# Patient Record
Sex: Female | Born: 1970 | Race: White | Hispanic: Yes | Marital: Married | Smoking: Never smoker
Health system: Southern US, Community
[De-identification: ages and names within clinical notes are randomized; demographics above are authoritative.]

## PROBLEM LIST (undated history)

## (undated) DIAGNOSIS — F32A Depression, unspecified: Secondary | ICD-10-CM

## (undated) DIAGNOSIS — I10 Essential (primary) hypertension: Secondary | ICD-10-CM

## (undated) DIAGNOSIS — IMO0001 Reserved for inherently not codable concepts without codable children: Secondary | ICD-10-CM

## (undated) DIAGNOSIS — N920 Excessive and frequent menstruation with regular cycle: Secondary | ICD-10-CM

## (undated) DIAGNOSIS — D649 Anemia, unspecified: Secondary | ICD-10-CM

## (undated) DIAGNOSIS — F329 Major depressive disorder, single episode, unspecified: Secondary | ICD-10-CM

## (undated) DIAGNOSIS — E7800 Pure hypercholesterolemia, unspecified: Secondary | ICD-10-CM

## (undated) HISTORY — DX: Pure hypercholesterolemia, unspecified: E78.00

## (undated) HISTORY — PX: ABDOMINAL HYSTERECTOMY: SHX81

## (undated) HISTORY — PX: BLADDER SURGERY: SHX569

## (undated) HISTORY — PX: CHOLECYSTECTOMY: SHX55

## (undated) HISTORY — DX: Excessive and frequent menstruation with regular cycle: N92.0

## (undated) HISTORY — PX: WISDOM TOOTH EXTRACTION: SHX21

---

## 2001-08-01 ENCOUNTER — Emergency Department (HOSPITAL_COMMUNITY): Admission: EM | Admit: 2001-08-01 | Discharge: 2001-08-01 | Payer: Self-pay | Admitting: *Deleted

## 2001-08-11 ENCOUNTER — Emergency Department (HOSPITAL_COMMUNITY): Admission: EM | Admit: 2001-08-11 | Discharge: 2001-08-11 | Payer: Self-pay | Admitting: Emergency Medicine

## 2005-08-23 ENCOUNTER — Ambulatory Visit: Payer: Self-pay | Admitting: Family Medicine

## 2005-08-24 ENCOUNTER — Ambulatory Visit: Payer: Self-pay | Admitting: *Deleted

## 2005-10-11 ENCOUNTER — Encounter (INDEPENDENT_AMBULATORY_CARE_PROVIDER_SITE_OTHER): Payer: Self-pay | Admitting: Family Medicine

## 2005-10-11 ENCOUNTER — Ambulatory Visit: Payer: Self-pay | Admitting: Family Medicine

## 2005-10-15 ENCOUNTER — Ambulatory Visit (HOSPITAL_COMMUNITY): Admission: RE | Admit: 2005-10-15 | Discharge: 2005-10-15 | Payer: Self-pay | Admitting: Family Medicine

## 2007-03-05 ENCOUNTER — Encounter (INDEPENDENT_AMBULATORY_CARE_PROVIDER_SITE_OTHER): Payer: Self-pay | Admitting: *Deleted

## 2007-03-13 ENCOUNTER — Encounter: Payer: Self-pay | Admitting: Internal Medicine

## 2007-03-13 ENCOUNTER — Ambulatory Visit: Payer: Self-pay | Admitting: Family Medicine

## 2007-03-13 LAB — CONVERTED CEMR LAB
ALT: 17 units/L (ref 0–35)
Basophils Absolute: 0.1 10*3/uL (ref 0.0–0.1)
CO2: 22 meq/L (ref 19–32)
Calcium: 9.4 mg/dL (ref 8.4–10.5)
Chloride: 104 meq/L (ref 96–112)
Eosinophils Absolute: 0.2 10*3/uL (ref 0.0–0.7)
Eosinophils Relative: 2 % (ref 0–5)
HCT: 38.4 % (ref 36.0–46.0)
Lymphocytes Relative: 30 % (ref 12–46)
Lymphs Abs: 3 10*3/uL (ref 0.7–3.3)
MCV: 79.8 fL (ref 78.0–100.0)
Monocytes Relative: 5 % (ref 3–11)
Neutro Abs: 6.3 10*3/uL (ref 1.7–7.7)
Platelets: 397 10*3/uL (ref 150–400)
Potassium: 4.1 meq/L (ref 3.5–5.3)
Total Bilirubin: 0.3 mg/dL (ref 0.3–1.2)
Total Protein: 7.6 g/dL (ref 6.0–8.3)

## 2007-05-13 ENCOUNTER — Emergency Department (HOSPITAL_COMMUNITY): Admission: EM | Admit: 2007-05-13 | Discharge: 2007-05-13 | Payer: Self-pay | Admitting: Emergency Medicine

## 2008-04-13 ENCOUNTER — Ambulatory Visit (HOSPITAL_COMMUNITY): Admission: RE | Admit: 2008-04-13 | Discharge: 2008-04-13 | Payer: Self-pay | Admitting: Family Medicine

## 2008-05-22 ENCOUNTER — Emergency Department (HOSPITAL_COMMUNITY): Admission: EM | Admit: 2008-05-22 | Discharge: 2008-05-23 | Payer: Self-pay | Admitting: Emergency Medicine

## 2008-05-27 ENCOUNTER — Ambulatory Visit: Payer: Self-pay | Admitting: Internal Medicine

## 2008-06-25 ENCOUNTER — Encounter (INDEPENDENT_AMBULATORY_CARE_PROVIDER_SITE_OTHER): Payer: Self-pay | Admitting: Family Medicine

## 2008-06-25 ENCOUNTER — Ambulatory Visit: Payer: Self-pay | Admitting: Family Medicine

## 2008-08-24 ENCOUNTER — Emergency Department (HOSPITAL_COMMUNITY): Admission: EM | Admit: 2008-08-24 | Discharge: 2008-08-25 | Payer: Self-pay | Admitting: *Deleted

## 2008-09-07 ENCOUNTER — Encounter (INDEPENDENT_AMBULATORY_CARE_PROVIDER_SITE_OTHER): Payer: Self-pay | Admitting: Family Medicine

## 2008-09-07 ENCOUNTER — Ambulatory Visit: Payer: Self-pay | Admitting: Internal Medicine

## 2008-09-07 LAB — CONVERTED CEMR LAB
Chloride: 104 meq/L (ref 96–112)
Glucose, Bld: 92 mg/dL (ref 70–99)
LDL Cholesterol: 110 mg/dL — ABNORMAL HIGH (ref 0–99)
Potassium: 4.5 meq/L (ref 3.5–5.3)
Sodium: 140 meq/L (ref 135–145)
Total CHOL/HDL Ratio: 5.6
Total Protein: 7.2 g/dL (ref 6.0–8.3)
Vit D, 25-Hydroxy: 15 ng/mL — ABNORMAL LOW (ref 30–89)

## 2008-10-04 ENCOUNTER — Encounter (INDEPENDENT_AMBULATORY_CARE_PROVIDER_SITE_OTHER): Payer: Self-pay | Admitting: General Surgery

## 2008-10-04 ENCOUNTER — Ambulatory Visit (HOSPITAL_COMMUNITY): Admission: RE | Admit: 2008-10-04 | Discharge: 2008-10-05 | Payer: Self-pay | Admitting: *Deleted

## 2009-06-27 IMAGING — RF DG CHOLANGIOGRAM OPERATIVE
1 series · 4 of 4 positions shown · non-contrast
Comparison: Ultrasound dated 08/25/2008

CLINICAL DATA: Cholelithiasis

INTRAOPERATIVE CHOLANGIOGRAM
TECHNIQUE: Multiple fluoroscopic spot radiographs were obtained
during intraoperative cholangiogram and are submitted for
interpretation post-operatively.
Fluoroscopy Time: 24 seconds

[Series 1: run · 4 of 46 frames shown]
[frame 7/46]
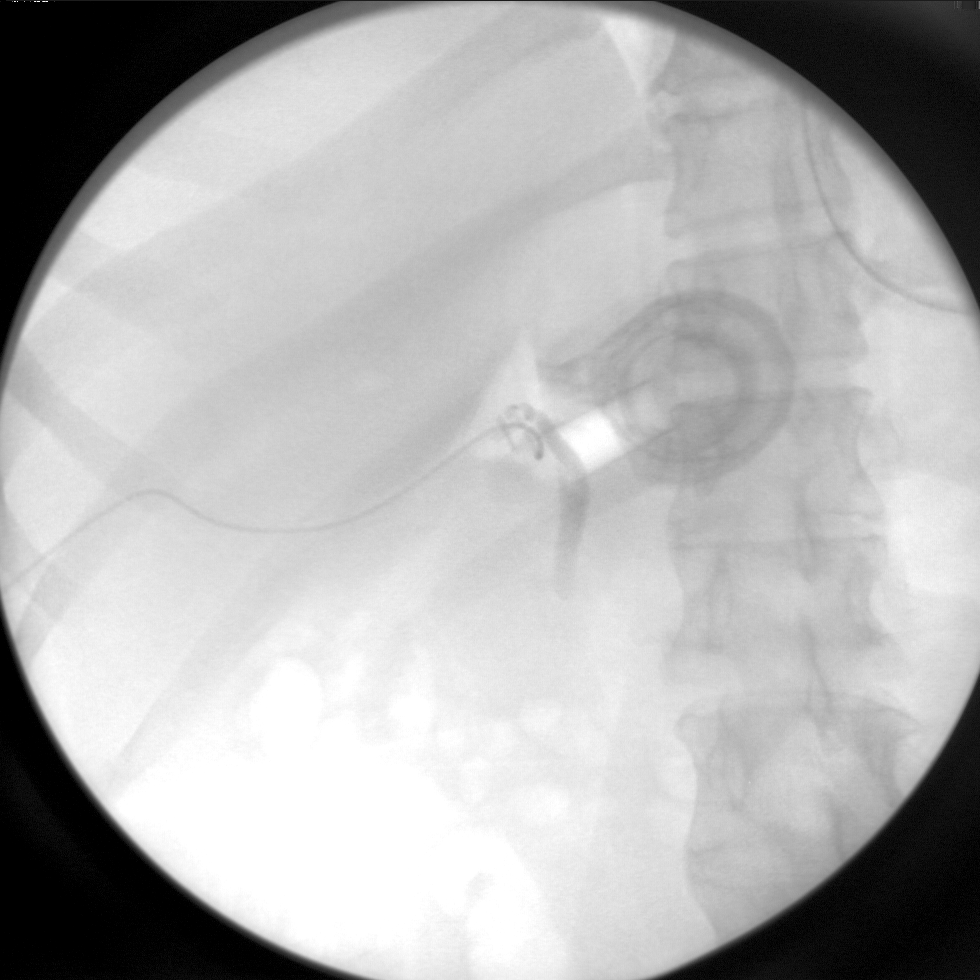
[frame 24/46]
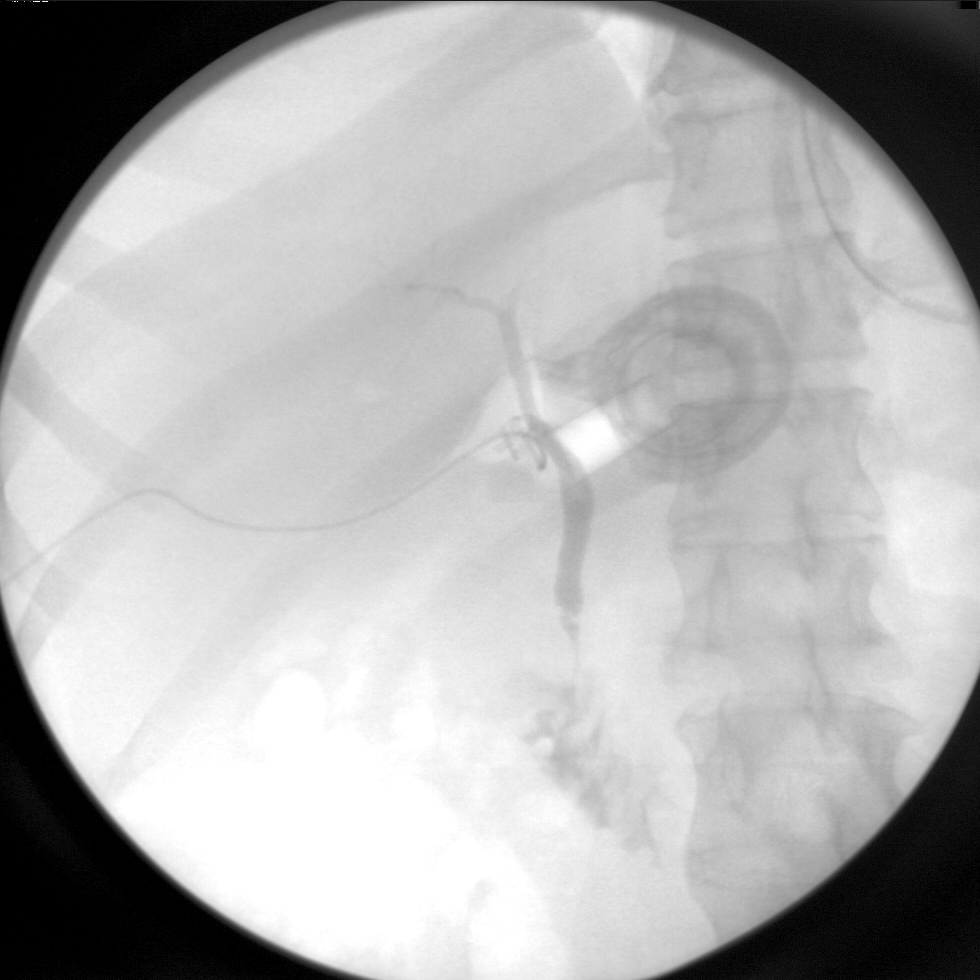
[frame 40/46]
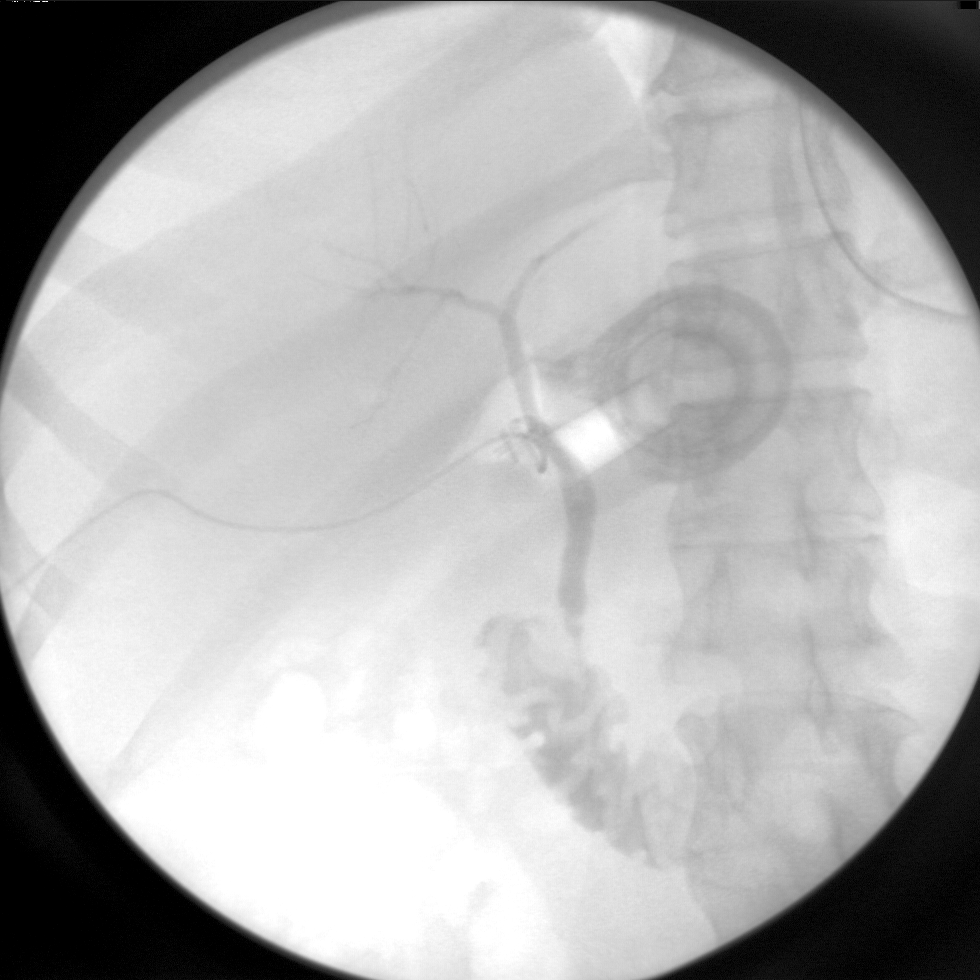
[frame 44/46]
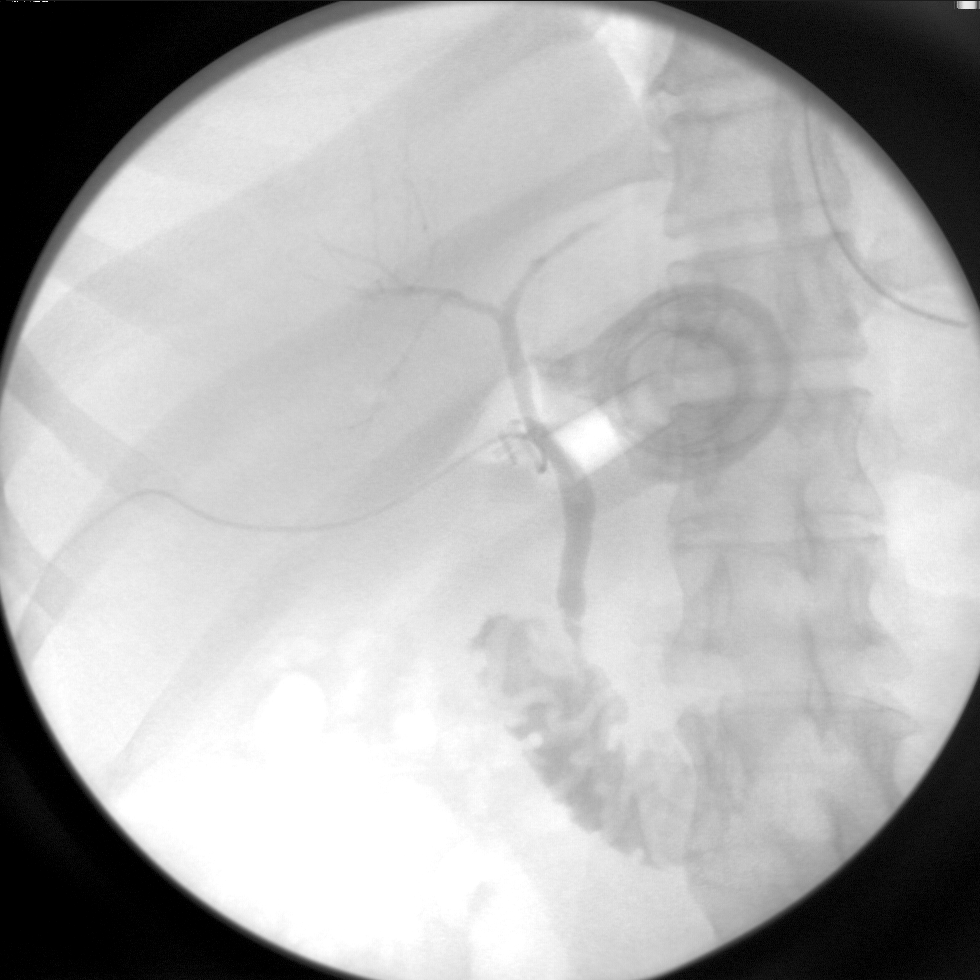

[4 of 4 positions shown; findings below may reference images not displayed]

FINDINGS: Contrast injection of the cystic duct opacifies a normal
caliber common bile duct with reflux into the intrahepatic ducts.
Appropriate drainage into the duodenum is identified.

No extravasation of contrast material noted.

Within the distal common bile duct there are several filling
defects which may represent gallstones.
IMPRESSION: 1.  Filling defects within the distal common bile duct may
represent choledocholithiasis.

## 2009-08-25 ENCOUNTER — Emergency Department (HOSPITAL_COMMUNITY): Admission: EM | Admit: 2009-08-25 | Discharge: 2009-08-26 | Payer: Self-pay | Admitting: Emergency Medicine

## 2010-07-27 ENCOUNTER — Emergency Department (HOSPITAL_COMMUNITY)
Admission: EM | Admit: 2010-07-27 | Discharge: 2010-07-27 | Disposition: A | Discharge status: Home / Self Care | Payer: Self-pay | Attending: Emergency Medicine | Admitting: Emergency Medicine

## 2010-07-27 DIAGNOSIS — R109 Unspecified abdominal pain: Secondary | ICD-10-CM | POA: Insufficient documentation

## 2010-07-27 DIAGNOSIS — R197 Diarrhea, unspecified: Secondary | ICD-10-CM | POA: Insufficient documentation

## 2010-07-27 DIAGNOSIS — R112 Nausea with vomiting, unspecified: Secondary | ICD-10-CM | POA: Insufficient documentation

## 2010-07-27 DIAGNOSIS — I10 Essential (primary) hypertension: Secondary | ICD-10-CM | POA: Insufficient documentation

## 2010-07-27 LAB — CBC
HCT: 36 % (ref 36.0–46.0)
MCHC: 33.1 g/dL (ref 30.0–36.0)
RDW: 15.6 % — ABNORMAL HIGH (ref 11.5–15.5)
WBC: 16.7 10*3/uL — ABNORMAL HIGH (ref 4.0–10.5)

## 2010-07-27 LAB — DIFFERENTIAL
Eosinophils Absolute: 0.1 10*3/uL (ref 0.0–0.7)
Lymphocytes Relative: 12 % (ref 12–46)
Monocytes Absolute: 0.6 10*3/uL (ref 0.1–1.0)
Monocytes Relative: 4 % (ref 3–12)
Neutro Abs: 14.1 10*3/uL — ABNORMAL HIGH (ref 1.7–7.7)
Neutrophils Relative %: 84 % — ABNORMAL HIGH (ref 43–77)

## 2010-07-27 LAB — URINALYSIS, ROUTINE W REFLEX MICROSCOPIC
Bilirubin Urine: NEGATIVE
Ketones, ur: 15 mg/dL — AB
Nitrite: NEGATIVE
Protein, ur: NEGATIVE mg/dL
Specific Gravity, Urine: 1.025 (ref 1.005–1.030)
Urine Glucose, Fasting: NEGATIVE mg/dL
pH: 6 (ref 5.0–8.0)

## 2010-07-27 LAB — COMPREHENSIVE METABOLIC PANEL
Albumin: 3.8 g/dL (ref 3.5–5.2)
Alkaline Phosphatase: 76 U/L (ref 39–117)
CO2: 25 mEq/L (ref 19–32)
Calcium: 9 mg/dL (ref 8.4–10.5)
Chloride: 104 mEq/L (ref 96–112)
Glucose, Bld: 129 mg/dL — ABNORMAL HIGH (ref 70–99)

## 2010-07-27 LAB — URINE MICROSCOPIC-ADD ON

## 2010-08-03 ENCOUNTER — Other Ambulatory Visit: Payer: Self-pay | Admitting: Family Medicine

## 2010-08-03 ENCOUNTER — Other Ambulatory Visit (HOSPITAL_COMMUNITY): Payer: Self-pay | Admitting: Family Medicine

## 2010-08-03 ENCOUNTER — Encounter (INDEPENDENT_AMBULATORY_CARE_PROVIDER_SITE_OTHER): Payer: Self-pay | Admitting: Family Medicine

## 2010-08-03 DIAGNOSIS — Z1231 Encounter for screening mammogram for malignant neoplasm of breast: Secondary | ICD-10-CM

## 2010-08-03 LAB — CONVERTED CEMR LAB
Alkaline Phosphatase: 72 units/L (ref 39–117)
Basophils Absolute: 0 10*3/uL (ref 0.0–0.1)
Basophils Relative: 0 % (ref 0–1)
CO2: 24 meq/L (ref 19–32)
Calcium: 9.3 mg/dL (ref 8.4–10.5)
Chloride: 101 meq/L (ref 96–112)
Eosinophils Absolute: 0.1 10*3/uL (ref 0.0–0.7)
HCT: 37.5 % (ref 36.0–46.0)
LDL Cholesterol: 152 mg/dL — ABNORMAL HIGH (ref 0–99)
Lymphocytes Relative: 26 % (ref 12–46)
Lymphs Abs: 2.5 10*3/uL (ref 0.7–4.0)
MCHC: 30.9 g/dL (ref 30.0–36.0)
Monocytes Absolute: 0.5 10*3/uL (ref 0.1–1.0)
Monocytes Relative: 5 % (ref 3–12)
Neutrophils Relative %: 68 % (ref 43–77)
RDW: 15.9 % — ABNORMAL HIGH (ref 11.5–15.5)
Total CHOL/HDL Ratio: 5
Total Protein: 7.4 g/dL (ref 6.0–8.3)
VLDL: 41 mg/dL — ABNORMAL HIGH (ref 0–40)

## 2010-08-07 ENCOUNTER — Ambulatory Visit (HOSPITAL_COMMUNITY)
Admission: RE | Admit: 2010-08-07 | Discharge: 2010-08-07 | Disposition: A | Discharge status: Home / Self Care | Payer: Self-pay | Source: Ambulatory Visit | Attending: Family Medicine | Admitting: Family Medicine

## 2010-08-07 DIAGNOSIS — Z1231 Encounter for screening mammogram for malignant neoplasm of breast: Secondary | ICD-10-CM | POA: Insufficient documentation

## 2010-09-10 LAB — COMPREHENSIVE METABOLIC PANEL
ALT: 23 U/L (ref 0–35)
Alkaline Phosphatase: 74 U/L (ref 39–117)
BUN: 7 mg/dL (ref 6–23)
Calcium: 8.7 mg/dL (ref 8.4–10.5)
GFR calc Af Amer: >60 mL/min (ref >60)
Glucose, Bld: 111 mg/dL — ABNORMAL HIGH (ref 70–99)
Sodium: 139 mEq/L (ref 135–145)

## 2010-09-10 LAB — URINALYSIS, ROUTINE W REFLEX MICROSCOPIC
Glucose, UA: NEGATIVE mg/dL
Ketones, ur: NEGATIVE mg/dL
Urobilinogen, UA: 0.2 mg/dL (ref 0.0–1.0)

## 2010-09-10 LAB — CBC
Hemoglobin: 10.9 g/dL — ABNORMAL LOW (ref 12.0–15.0)
MCHC: 33.4 g/dL (ref 30.0–36.0)
MCV: 78.5 fL (ref 78.0–100.0)
Platelets: 367 10*3/uL (ref 150–400)
RDW: 15.7 % — ABNORMAL HIGH (ref 11.5–15.5)
WBC: 12.3 10*3/uL — ABNORMAL HIGH (ref 4.0–10.5)

## 2010-09-10 LAB — DIFFERENTIAL
Basophils Relative: 1 % (ref 0–1)
Lymphocytes Relative: 27 % (ref 12–46)
Monocytes Absolute: 0.6 10*3/uL (ref 0.1–1.0)
Monocytes Relative: 5 % (ref 3–12)
Neutrophils Relative %: 67 % (ref 43–77)

## 2010-09-10 LAB — WET PREP, GENITAL
Trich, Wet Prep: NONE SEEN
Yeast Wet Prep HPF POC: NONE SEEN

## 2010-09-27 LAB — DIFFERENTIAL
Basophils Absolute: 0 10*3/uL (ref 0.0–0.1)
Eosinophils Relative: 1 % (ref 0–5)
Lymphocytes Relative: 23 % (ref 12–46)
Monocytes Absolute: 0.4 10*3/uL (ref 0.1–1.0)

## 2010-09-27 LAB — CBC
MCV: 77.3 fL — ABNORMAL LOW (ref 78.0–100.0)
Platelets: 331 10*3/uL (ref 150–400)
WBC: 9.1 10*3/uL (ref 4.0–10.5)

## 2010-09-27 LAB — COMPREHENSIVE METABOLIC PANEL
ALT: 17 U/L (ref 0–35)
AST: 18 U/L (ref 0–37)
Albumin: 3.6 g/dL (ref 3.5–5.2)
Chloride: 105 mEq/L (ref 96–112)
Creatinine, Ser: 0.58 mg/dL (ref 0.4–1.2)
GFR calc Af Amer: >60 mL/min (ref >60)
Potassium: 4 mEq/L (ref 3.5–5.1)
Sodium: 138 mEq/L (ref 135–145)
Total Bilirubin: 0.5 mg/dL (ref 0.3–1.2)

## 2010-09-28 LAB — DIFFERENTIAL
Eosinophils Absolute: 0.2 10*3/uL (ref 0.0–0.7)
Lymphocytes Relative: 29 % (ref 12–46)
Lymphs Abs: 3.5 10*3/uL (ref 0.7–4.0)
Neutrophils Relative %: 65 % (ref 43–77)

## 2010-09-28 LAB — LIPASE, BLOOD: Lipase: 31 U/L (ref 11–59)

## 2010-09-28 LAB — COMPREHENSIVE METABOLIC PANEL
CO2: 25 mEq/L (ref 19–32)
Calcium: 9 mg/dL (ref 8.4–10.5)
Creatinine, Ser: 0.59 mg/dL (ref 0.4–1.2)
GFR calc non Af Amer: >60 mL/min (ref >60)
Glucose, Bld: 114 mg/dL — ABNORMAL HIGH (ref 70–99)

## 2010-09-28 LAB — URINALYSIS, ROUTINE W REFLEX MICROSCOPIC
Bilirubin Urine: NEGATIVE
Hgb urine dipstick: NEGATIVE
Specific Gravity, Urine: 1.01 (ref 1.005–1.030)
Urobilinogen, UA: 0.2 mg/dL (ref 0.0–1.0)
pH: 6 (ref 5.0–8.0)

## 2010-09-28 LAB — CBC
Hemoglobin: 12 g/dL (ref 12.0–15.0)
MCHC: 33.5 g/dL (ref 30.0–36.0)
MCV: 78.1 fL (ref 78.0–100.0)
RBC: 4.59 MIL/uL (ref 3.87–5.11)

## 2010-10-31 NOTE — Op Note (Signed)
NAME:  Darlene Salazar, Darlene Salazar ACCOUNT NO.:  [id number]   MEDICAL RECORD NO.:  [id number]          PATIENT TYPE:  OIB   LOCATION:  0098                         FACILITY:  [facility]   PHYSICIAN:  Adolph Pollack, M.D.DATE OF BIRTH:  [birth year]   DATE OF PROCEDURE:  10/04/2008  DATE OF DISCHARGE:                               OPERATIVE REPORT   PREOPERATIVE DIAGNOSIS:  Symptomatic cholelithiasis.   POSTOPERATIVE DIAGNOSIS:  Symptomatic cholelithiasis.   PROCEDURE:  Laparoscopic cholecystectomy with intraoperative  cholangiogram.   SURGEON:  Adolph Pollack, M.D.   ASSISTANT:  Anselm Pancoast. Zachery Dakins, M.D.   ANESTHESIA:  General.   INDICATIONS:  This is a 40 year old female who presented to the [facility]  Emergency Department with right upper quadrant postprandial pain and was  diagnosed with gallstones.  Liver function tests were normal.  She was  arranged to have elective cholecystectomy by Alfonse Ras, MD, but  because of medical leave I saw her and I am performing the surgery  today.  The procedure and the risks have been explained to her before in  Spanish and she has been given a gallbladder surgery pamphlet in Spanish  as well.   TECHNIQUE:  She was seen in the holding area and brought to the  operating room, placed supine on the operating table and a general  anesthetic was administered.  Her abdominal wall was sterilely prepped  and draped.  Marcaine solution was infiltrated in the subumbilical  region.  A small subumbilical incision was made through the skin,  subcutaneous tissue, midline fascia and peritoneum, entering the  peritoneal cavity under direct vision.  A pursestring suture of zero  Vicryl was placed around the fascial edges.  A Hasson trocar was  introduced into the peritoneal cavity and pneumoperitoneum created by  insufflation of CO2 gas.   Following this the laparoscope was introduced.  She was then placed in a  reverse Trendelenburg position  with the right side tilted slightly up.  An 11 mm trocar was placed through th epigastric incision and two 5 mm  trocars were placed in the right upper quadrant.  The fundus of the  gallbladder was grasped and there was no acute inflammation.  It was  retracted to the right shoulder.  The infundibulum was grasped and it  was mobilized using careful blunt and sharp dissection close to the  gallbladder.  The infundibulum was then retracted laterally.  The cystic  duct identified and a window created around it.  A small stone was  trying to pass into the cystic duct and this was milked back into the  gallbladder.  A clip was placed just above the cystic duct gallbladder  junction and a small incision made at the cystic duct gallbladder  junction.  A cholangiocatheter was passed through the anterior abdominal  wall and placed into the cystic duct and cholangiogram was performed.   Under real time fluoroscopy dilute contrast was injected to the cystic  duct.  The cystic duct was of moderate length.  The common hepatic,  right and left hepatic, and common bile ducts all filled and the bile  drained promptly into the duodenum without obvious evidence of  obstruction.  A final report is pending the radiologist interpretation.   The cholangiocatheter was removed, the cystic duct was clipped three  times on the biliary side and divided.  Both an anterior and posterior  branch of the cystic artery were identified.  Windows were made around  these.  They were clipped and divided.  The gallbladder was dissected  free from the liver intact using electrocautery and placed in an  Endopouch bag.  The gallbladder fossa was inspected and irrigated.  Bleeding points were controlled with electrocautery.  Upon further  inspection, hemostasis was adequate.  There was no evidence of bile  leak.   The irrigation fluid was evacuated as much as possible.  The gallbladder  was removed in the bag through the  subumbilical port.  The subumbilical  fascial defect was closed under laparoscopic vision by tightening up and  tying down the pursestring suture.  The remaining trocars were removed.  The CO2 gas was released.  The skin incisions were closed with 4-0  Monocryl subcuticular stitches, followed by Steri-Strips and sterile  dressings.   She tolerated the procedure well without any apparent complications and  was taken to the recovery room in satisfactory condition.      Adolph Pollack, M.D.  Electronically Signed     TJR/MEDQ  D:  10/04/2008  T:  10/04/2008  Job:  [id number]

## 2011-03-23 LAB — POCT I-STAT, CHEM 8
BUN: 10 mg/dL (ref 6–23)
Chloride: 102 mEq/L (ref 96–112)
HCT: 38 % (ref 36.0–46.0)
Sodium: 138 mEq/L (ref 135–145)
TCO2: 27 mmol/L (ref 0–100)

## 2011-03-27 LAB — DIFFERENTIAL
Basophils Relative: 1
Lymphocytes Relative: 24
Lymphs Abs: 3.2
Monocytes Absolute: 0.6
Monocytes Relative: 4
Neutro Abs: 9.2 — ABNORMAL HIGH

## 2011-03-27 LAB — CBC
Hemoglobin: 12.7
MCHC: 33
RBC: 4.9
WBC: 13.3 — ABNORMAL HIGH

## 2011-03-27 LAB — BASIC METABOLIC PANEL
CO2: 26
Calcium: 9.6
GFR calc Af Amer: >60
Sodium: 136

## 2011-07-24 ENCOUNTER — Other Ambulatory Visit: Payer: Self-pay

## 2011-07-24 ENCOUNTER — Encounter (HOSPITAL_COMMUNITY): Payer: Self-pay | Admitting: *Deleted

## 2011-07-24 ENCOUNTER — Emergency Department (HOSPITAL_COMMUNITY): Payer: Self-pay

## 2011-07-24 ENCOUNTER — Emergency Department (HOSPITAL_COMMUNITY)
Admission: EM | Admit: 2011-07-24 | Discharge: 2011-07-24 | Disposition: A | Discharge status: Home / Self Care | Payer: Self-pay | Attending: Emergency Medicine | Admitting: Emergency Medicine

## 2011-07-24 DIAGNOSIS — R0789 Other chest pain: Secondary | ICD-10-CM | POA: Insufficient documentation

## 2011-07-24 DIAGNOSIS — R110 Nausea: Secondary | ICD-10-CM | POA: Insufficient documentation

## 2011-07-24 DIAGNOSIS — I10 Essential (primary) hypertension: Secondary | ICD-10-CM | POA: Insufficient documentation

## 2011-07-24 DIAGNOSIS — R0602 Shortness of breath: Secondary | ICD-10-CM | POA: Insufficient documentation

## 2011-07-24 HISTORY — DX: Essential (primary) hypertension: I10

## 2011-07-24 LAB — POCT I-STAT, CHEM 8
BUN: 7 mg/dL (ref 6–23)
Chloride: 105 mEq/L (ref 96–112)
Creatinine, Ser: 0.6 mg/dL (ref 0.50–1.10)
Potassium: 3.8 mEq/L (ref 3.5–5.1)
Sodium: 141 mEq/L (ref 135–145)

## 2011-07-24 LAB — D-DIMER, QUANTITATIVE: D-Dimer, Quant: 0.83 ug/mL-FEU — ABNORMAL HIGH (ref 0.00–0.48)

## 2011-07-24 MED ORDER — IBUPROFEN 600 MG PO TABS: 600.0000 mg | ORAL_TABLET | Freq: Four times a day (QID) | ORAL | Status: AC | PRN

## 2011-07-24 NOTE — ED Provider Notes (Signed)
History     CSN: [id number]  Arrival date & time 07/24/11  1223   First MD Initiated Contact with Patient 07/24/11 1355      Chief Complaint  Patient presents with  . Shortness of Breath    (Consider location/radiation/quality/duration/timing/severity/associated sxs/prior treatment) HPI Comments: 41yo HF with PMH significant for HTN who presents to the ED due to pain in left lateral chest. Worse with touch and deep breathing.   Patient is a 41 y.o. female presenting with shortness of breath and chest pain. The history is provided by the patient and a relative. A language interpreter was used.  Shortness of Breath  The current episode started yesterday. The problem occurs occasionally. The problem has been unchanged. The problem is mild. The symptoms are relieved by nothing. Exacerbated by: palpation of left side of chest. Associated symptoms include chest pain (left side laterally) and shortness of breath (mild). Pertinent negatives include no orthopnea, no fever, no rhinorrhea, no sore throat, no cough and no wheezing.  Chest Pain Episode onset: yesterday. Chest pain occurs intermittently. The chest pain is resolved. The pain is associated with breathing. The severity of the pain is mild. The quality of the pain is described as sharp and aching. The pain does not radiate. Chest pain is worsened by certain positions and deep breathing (not worse with exertion). Primary symptoms include shortness of breath (mild) and nausea (with pain). Pertinent negatives for primary symptoms include no fever, no fatigue, no syncope, no cough, no wheezing, no palpitations, no abdominal pain, no vomiting and no dizziness.  Pertinent negatives for associated symptoms include no claudication, no diaphoresis, no lower extremity edema, no near-syncope, no numbness, no orthopnea, no paroxysmal nocturnal dyspnea and no weakness. She tried nothing for the symptoms.  Her past medical history is significant for  hypertension.  Pertinent negatives for past medical history include no CAD, no DVT, no PE and no seizures.     Past Medical History  Diagnosis Date  . Hypertension     Past Surgical History  Procedure Date  . Cholecystectomy     No family history on file.  History  Substance Use Topics  . Smoking status: Never Smoker   . Smokeless tobacco: Not on file  . Alcohol Use: No    OB History    Grav Para Term Preterm Abortions TAB SAB Ect Mult Living                  Review of Systems  Constitutional: Negative for fever, chills, diaphoresis, activity change, appetite change and fatigue.  HENT: Negative for congestion, sore throat, rhinorrhea, neck pain and neck stiffness.   Eyes: Negative for photophobia, redness and visual disturbance.  Respiratory: Positive for shortness of breath (mild). Negative for cough and wheezing.   Cardiovascular: Positive for chest pain (left side laterally). Negative for palpitations, orthopnea, claudication, leg swelling, syncope and near-syncope.  Gastrointestinal: Positive for nausea (with pain). Negative for vomiting, abdominal pain, diarrhea, constipation and blood in stool.  Genitourinary: Negative for dysuria, urgency, hematuria and flank pain.  Musculoskeletal: Negative for back pain.  Skin: Negative for rash and wound.  Neurological: Negative for dizziness, seizures, facial asymmetry, speech difficulty, weakness, light-headedness, numbness and headaches.  Psychiatric/Behavioral: Negative for confusion.  All other systems reviewed and are negative.    Allergies  Review of patient's allergies indicates no known allergies.  Home Medications   Current Outpatient Rx  Name Route Sig Dispense Refill  . ACETAMINOPHEN 500 MG PO TABS Oral  Take 500 mg by mouth every 6 (six) hours as needed. For pain      BP 138/83  Pulse 82  Temp(Src) 97.9 F (36.6 C) (Oral)  Resp 16  Ht 4\' 10"  (1.473 m)  SpO2 99%  LMP 06/27/2011  Physical Exam    Nursing note and vitals reviewed. Constitutional: She is oriented to person, place, and time. She appears well-developed and well-nourished.  Non-toxic appearance. No distress.  HENT:  Head: Normocephalic and atraumatic.  Mouth/Throat: Oropharynx is clear and moist.  Eyes: Conjunctivae and EOM are normal. Pupils are equal, round, and reactive to light. No scleral icterus.  Neck: Normal range of motion. Neck supple. No JVD present.  Cardiovascular: Normal rate, regular rhythm, normal heart sounds and intact distal pulses.   No murmur heard. Pulmonary/Chest: Effort normal and breath sounds normal. No respiratory distress. She has no wheezes. She has no rales. She exhibits tenderness (left lateral chest wall just beneath axilla. ).       Breast exam with female RN present.   Breast without palpable masses. No palpable lymph nodes. Pain with palpation inferior to axillary breast tissue on left lateral chest wall.   Abdominal: Soft. Bowel sounds are normal. She exhibits no distension. There is no tenderness. There is no rebound and no guarding.  Musculoskeletal: Normal range of motion. She exhibits no edema.  Neurological: She is alert and oriented to person, place, and time. She has normal strength. No cranial nerve deficit. GCS eye subscore is 4. GCS verbal subscore is 5. GCS motor subscore is 6.  Skin: Skin is warm and dry. No rash noted. She is not diaphoretic.  Psychiatric: She has a normal mood and affect.    ED Course  Procedures (including critical care time)  Labs Reviewed  D-DIMER, QUANTITATIVE - Abnormal; Notable for the following:    D-Dimer, Quant 0.83 (*)    All other components within normal limits  POCT PREGNANCY, URINE   Dg Chest 2 View  07/24/2011  *RADIOLOGY REPORT*  Clinical Data: Shortness of breath  CHEST - 2 VIEW  Comparison: 10/15/2005  Findings: The heart size and mediastinal contours are within normal limits.  Both lungs are clear.  The visualized skeletal  structures are unremarkable.  IMPRESSION: Negative exam.  Original Report Authenticated By: Rosealee Albee, M.D.     1. Musculoskeletal chest pain       MDM  41yo HF with PMH significant for HTN who presents to the ED due to pain in left lateral chest. Feels like it takes her breath away when pain comes on for a few seconds at a time. Pain worse with deep breaths and palpation. No pain at rest or with exertion. Certain positional changes makes pain worse. Pt 41 and no hx other than HTN and BP controlled in ED. Not c/w ACS. Considered PE but doubt as reproducible. Getting EKG and d-dimer. Will get CXR. Breast exam performed with RN as pt points to outer breast pain. No palpable masses. Has had neg mammogram in past. Instructed pt need for mammography and ultrasound of breast as followup and she confirms understanding.   UPT neg.   CXR neg. Dimer elevated but discussed with Dr. Radford Pax. Dimer less than twice normal level. Recent research states that in pts like this with reporducible MSK type pain who are PERC negative can be discharged if dimer < 2x normal.   At 4:13 PM pt reassessed and has no active pain or dyspnea. Results reviewed as well as  recommendation for f/u.      Verne Carrow, MD 07/24/11 [phone]

## 2011-07-24 NOTE — ED Notes (Signed)
Patient transported to X-ray per [facility]

## 2011-07-24 NOTE — ED Notes (Signed)
Saturday morning felt dizzy and vision became blurred.  Sunday she got dizzy and was having hard time breathing.  THen Sunday nite had a pain on the left side of her breast and felt like that is what was keeping her from breathing right.  Pt sts hard time breathing last night

## 2011-07-25 NOTE — ED Provider Notes (Signed)
I saw and evaluated the patient, reviewed the resident's note and I agree with the findings and plan.   .Face to face Exam:  General:  Awake HEENT:  Atraumatic Resp:  Normal effort Abd:  Nondistended Neuro:No focal weakness Lymph: No adenopathy   Nelia Shi, MD 07/25/11 2005

## 2011-09-05 ENCOUNTER — Other Ambulatory Visit (HOSPITAL_COMMUNITY): Payer: Self-pay | Admitting: Family Medicine

## 2011-09-05 DIAGNOSIS — Z1231 Encounter for screening mammogram for malignant neoplasm of breast: Secondary | ICD-10-CM

## 2011-10-04 ENCOUNTER — Ambulatory Visit (HOSPITAL_COMMUNITY): Payer: Self-pay

## 2011-10-30 ENCOUNTER — Ambulatory Visit (HOSPITAL_COMMUNITY): Payer: Self-pay

## 2011-12-11 ENCOUNTER — Ambulatory Visit (HOSPITAL_COMMUNITY)
Admission: RE | Admit: 2011-12-11 | Discharge: 2011-12-11 | Disposition: A | Discharge status: Home / Self Care | Payer: Self-pay | Source: Ambulatory Visit | Attending: Family Medicine | Admitting: Family Medicine

## 2011-12-11 ENCOUNTER — Encounter (HOSPITAL_COMMUNITY): Payer: Self-pay

## 2011-12-11 DIAGNOSIS — Z1231 Encounter for screening mammogram for malignant neoplasm of breast: Secondary | ICD-10-CM

## 2012-01-11 ENCOUNTER — Other Ambulatory Visit (HOSPITAL_COMMUNITY): Payer: Self-pay | Admitting: Family Medicine

## 2012-01-11 DIAGNOSIS — R072 Precordial pain: Secondary | ICD-10-CM

## 2012-01-14 ENCOUNTER — Ambulatory Visit (HOSPITAL_COMMUNITY): Payer: Self-pay | Attending: Cardiology

## 2012-01-14 ENCOUNTER — Encounter: Payer: Self-pay | Admitting: Family Medicine

## 2012-01-14 ENCOUNTER — Ambulatory Visit (HOSPITAL_BASED_OUTPATIENT_CLINIC_OR_DEPARTMENT_OTHER): Payer: Self-pay

## 2012-01-14 DIAGNOSIS — I10 Essential (primary) hypertension: Secondary | ICD-10-CM | POA: Insufficient documentation

## 2012-01-14 DIAGNOSIS — R072 Precordial pain: Secondary | ICD-10-CM

## 2012-01-14 DIAGNOSIS — E669 Obesity, unspecified: Secondary | ICD-10-CM | POA: Insufficient documentation

## 2012-01-14 DIAGNOSIS — R5381 Other malaise: Secondary | ICD-10-CM | POA: Insufficient documentation

## 2012-01-14 NOTE — Progress Notes (Signed)
Echocardiogram performed.  

## 2012-12-06 ENCOUNTER — Ambulatory Visit (INDEPENDENT_AMBULATORY_CARE_PROVIDER_SITE_OTHER): Payer: Self-pay | Admitting: Family Medicine

## 2012-12-06 VITALS — BP 102/69 | HR 80 | Temp 98.4°F | Resp 16 | Ht <= 58 in | Wt 178.8 lb

## 2012-12-06 DIAGNOSIS — M545 Low back pain, unspecified: Secondary | ICD-10-CM

## 2012-12-06 MED ORDER — METHOCARBAMOL 500 MG PO TABS: ORAL_TABLET | ORAL | Status: DC

## 2012-12-06 MED ORDER — TRAMADOL HCL 50 MG PO TABS: 50.0000 mg | ORAL_TABLET | Freq: Three times a day (TID) | ORAL | Status: DC | PRN

## 2012-12-06 MED ORDER — OXAPROZIN 600 MG PO TABS: ORAL_TABLET | ORAL | Status: DC

## 2012-12-06 NOTE — Progress Notes (Signed)
Subjective: For about 4 months or so the patient has been having some low back pain. She got a massage and did some better for a followup. Over the last month she's been having bad low back pain, especially in the right side. She has had some numbness in her left leg. 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 She has a good bed at home. She has not been taking any medication for this. Increase low back pain with her menses, and when she strained her bowels.  Objective: Overweight lady in no major distress. She does not speak good [place], but her daughter came and transferred for her. Her abdomen was soft. Mild left lower quadrant tenderness. The spine appears straight. Good flexion, extension, side to side rotation. Deep tender reflexes trace to 1+ and symmetrical.  Assessment: Low back pain Dysmenorrhea  Plan: Low back instruction. Muscle relaxants and nonsteroidal anti-inflammatory agents. Return if worse

## 2012-12-06 NOTE — Patient Instructions (Addendum)
Take the Robaxin(methocarbamol) one in the morning, one in the afternoon, and 2 at bedtime as needed for muscle relaxation  Take the Daypro (oxaprozin) one twice daily at breakfast and supper for pain and inflammation in the back. This should also help for pain that she has at the time of her menstrual cycle  Use the tramadol only when needed for pain. This can be taken every 6 hours when necessary.  Return if worse  If you continue having the pains especially when you had of your periods, return for a female pelvic examination.

## 2013-03-25 ENCOUNTER — Other Ambulatory Visit (HOSPITAL_COMMUNITY): Payer: Self-pay | Admitting: Geriatric Medicine

## 2013-03-25 DIAGNOSIS — Z1231 Encounter for screening mammogram for malignant neoplasm of breast: Secondary | ICD-10-CM

## 2013-04-09 ENCOUNTER — Ambulatory Visit (HOSPITAL_COMMUNITY)
Admission: RE | Admit: 2013-04-09 | Discharge: 2013-04-09 | Disposition: A | Discharge status: Home / Self Care | Payer: Self-pay | Source: Ambulatory Visit | Attending: Geriatric Medicine | Admitting: Geriatric Medicine

## 2013-04-09 DIAGNOSIS — Z1231 Encounter for screening mammogram for malignant neoplasm of breast: Secondary | ICD-10-CM

## 2013-04-20 ENCOUNTER — Ambulatory Visit: Payer: No Typology Code available for payment source | Attending: Internal Medicine

## 2013-05-26 ENCOUNTER — Ambulatory Visit: Payer: No Typology Code available for payment source | Attending: Internal Medicine | Admitting: Internal Medicine

## 2013-05-26 ENCOUNTER — Encounter: Payer: Self-pay | Admitting: Internal Medicine

## 2013-05-26 VITALS — BP 120/80 | HR 85 | Temp 98.5°F | Resp 18 | Ht <= 58 in | Wt 185.0 lb

## 2013-05-26 DIAGNOSIS — M545 Low back pain, unspecified: Secondary | ICD-10-CM | POA: Insufficient documentation

## 2013-05-26 DIAGNOSIS — M549 Dorsalgia, unspecified: Secondary | ICD-10-CM

## 2013-05-26 LAB — COMPLETE METABOLIC PANEL WITH GFR
ALT: 15 U/L (ref 0–35)
AST: 25 U/L (ref 0–37)
Albumin: 4.1 g/dL (ref 3.5–5.2)
Alkaline Phosphatase: 81 U/L (ref 39–117)
GFR, Est Non African American: >89 mL/min
Glucose, Bld: 94 mg/dL (ref 70–99)
Potassium: 4.1 mEq/L (ref 3.5–5.3)
Sodium: 135 mEq/L (ref 135–145)
Total Protein: 7.5 g/dL (ref 6.0–8.3)

## 2013-05-26 LAB — CBC WITH DIFFERENTIAL/PLATELET
Basophils Relative: 1 % (ref 0–1)
Hemoglobin: 11.4 g/dL — ABNORMAL LOW (ref 12.0–15.0)
MCHC: 33.1 g/dL (ref 30.0–36.0)
Monocytes Relative: 6 % (ref 3–12)
Neutro Abs: 6.9 10*3/uL (ref 1.7–7.7)
Neutrophils Relative %: 63 % (ref 43–77)
Platelets: 440 10*3/uL — ABNORMAL HIGH (ref 150–400)
RBC: 4.56 MIL/uL (ref 3.87–5.11)

## 2013-05-26 NOTE — Progress Notes (Signed)
Pt here f/u back pain x 1 mnth C/o anxiety SOB with exertion noted Tylenol not working for pain Spanish interpretor present

## 2013-05-26 NOTE — Progress Notes (Signed)
Patient ID: Darlene Salazar, female   DOB: July 27, 1970, 42 y.o.   MRN: [id number]   CC:  HPI: 42 year old female who is here for evaluation of low back pain. Patient has been having this pain for about a month. However I do see that she was in the ER on 6/21 with right-sided low back pain. She also complained of some numbness in her left leg at that time. Today she complains of midthoracic pain, exacerbated by taking a deep breath. This location and this particular symptom has been present only for one month. She is also noticed dyspnea on exertion, but denies any chest pain  She complains of severe dysmenorrhea, with her menstrual cycles, she has noticed a change only for the last one year. Her menstrual cycle is otherwise normal flow    No Known Allergies Past Medical History  Diagnosis Date  . Hypertension    Current Outpatient Prescriptions on File Prior to Visit  Medication Sig Dispense Refill  . acetaminophen (TYLENOL) 500 MG tablet Take 500 mg by mouth every 6 (six) hours as needed. For pain      . methocarbamol (ROBAXIN) 500 MG tablet Take one in the morning, one in the afternoon, and 2 at bedtime as necessary for muscle relaxant  40 tablet  1  . oxaprozin (DAYPRO) 600 MG tablet One twice daily for pain and inflammation  30 tablet  1  . traMADol (ULTRAM) 50 MG tablet Take 1 tablet (50 mg total) by mouth every 8 (eight) hours as needed for pain.  20 tablet  0   No current facility-administered medications on file prior to visit.   History reviewed. No pertinent family history. History   Social History  . Marital Status: Married    Spouse Name: N/A    Number of Children: N/A  . Years of Education: N/A   Occupational History  . Not on file.   Social History Main Topics  . Smoking status: Never Smoker   . Smokeless tobacco: Not on file  . Alcohol Use: No  . Drug Use: No  . Sexual Activity: Yes   Other Topics Concern  . Not on file   Social History Narrative   ** Merged History Encounter **        Review of Systems  Constitutional: Negative for fever, chills, diaphoresis, activity change, appetite change and fatigue.  HENT: Negative for ear pain, nosebleeds, congestion, facial swelling, rhinorrhea, neck pain, neck stiffness and ear discharge.   Eyes: Negative for pain, discharge, redness, itching and visual disturbance.  Respiratory: Negative for cough, choking, chest tightness, shortness of breath, wheezing and stridor.   Cardiovascular: Negative for chest pain, palpitations and leg swelling.  Gastrointestinal: Negative for abdominal distention.  Genitourinary: Negative for dysuria, urgency, frequency, hematuria, flank pain, decreased urine volume, difficulty urinating and dyspareunia.  Musculoskeletal: Negative for back pain, joint swelling, arthralgias and gait problem.  Neurological: Negative for dizziness, tremors, seizures, syncope, facial asymmetry, speech difficulty, weakness, light-headedness, numbness and headaches.  Hematological: Negative for adenopathy. Does not bruise/bleed easily.  Psychiatric/Behavioral: Negative for hallucinations, behavioral problems, confusion, dysphoric mood, decreased concentration and agitation.    Objective:   Filed Vitals:   05/26/13 1655  BP: 120/80  Pulse: 85  Temp: 98.5 F (36.9 C)  Resp: 18    Physical Exam  Constitutional: Appears well-developed and well-nourished. No distress.  HENT: Normocephalic. External right and left ear normal. Oropharynx is clear and moist.  Eyes: Conjunctivae and EOM are normal. PERRLA, no scleral icterus.  Neck: Normal ROM. Neck supple. No JVD. No tracheal deviation. No thyromegaly.  CVS: RRR, S1/S2 +, no murmurs, no gallops, no carotid bruit.  Pulmonary: Effort and breath sounds normal, no stridor, rhonchi, wheezes, rales.  Abdominal: Soft. BS +,  no distension, tenderness, rebound or guarding.  Musculoskeletal: Normal range of motion. No edema and no tenderness.   Lymphadenopathy: No lymphadenopathy noted, cervical, inguinal. Neuro: Alert. Normal reflexes, muscle tone coordination. No cranial nerve deficit. Skin: Skin is warm and dry. No rash noted. Not diaphoretic. No erythema. No pallor.  Psychiatric: Normal mood and affect. Behavior, judgment, thought content normal.   Lab Results  Component Value Date   WBC 9.6 08/03/2010   HGB 12.6 07/24/2011   HCT 37.0 07/24/2011   MCV 77.8* 08/03/2010   PLT 387 08/03/2010   Lab Results  Component Value Date   CREATININE 0.60 07/24/2011   BUN 7 07/24/2011   NA 141 07/24/2011   K 3.8 07/24/2011   CL 105 07/24/2011   CO2 24 08/03/2010    No results found for this basename: HGBA1C   Lipid Panel     Component Value Date/Time   CHOL 241* 08/03/2010 2058   TRIG 206* 08/03/2010 2058   HDL 48 08/03/2010 2058   CHOLHDL 5.0 Ratio 08/03/2010 2058   VLDL 41* 08/03/2010 2058   LDLCALC 152* 08/03/2010 2058       Assessment and plan:   There are no active problems to display for this patient.  Low back pain likely secondary to dysmenorrhea Advised to take ibuprofen which is over-the-counter Will do a pelvic ultrasound to rule out fibroid Will order CBC to rule out anemia      Midthoracic pain with pleuritic chest pain Will do an EKG, d-dimer, troponin Patient asked to come back in 2 days for a followup To rule out DVT we will schedule a Doppler ultrasound    The patient was given clear instructions to go to ER or return to medical center if symptoms don't improve, worsen or new problems develop. The patient verbalized understanding. The patient was told to call to get any lab results if not heard anything in the next week.

## 2013-05-27 LAB — TROPONIN I: Troponin I: <0.01 ng/mL (ref <0.06)

## 2013-05-28 ENCOUNTER — Telehealth: Payer: Self-pay | Admitting: Emergency Medicine

## 2013-05-28 ENCOUNTER — Ambulatory Visit: Payer: No Typology Code available for payment source | Attending: Internal Medicine

## 2013-05-28 MED ORDER — FERROUS SULFATE 325 (65 FE) MG PO TABS: 325.0000 mg | ORAL_TABLET | Freq: Every day | ORAL | Status: DC

## 2013-05-28 NOTE — Telephone Encounter (Signed)
Spoke with pt. U/S appt scheduled 06/03/13. Pt informed to go [facility] 1st floor Radiology department with full bladder. Verbalized understanding

## 2013-05-29 MED ORDER — METHOCARBAMOL 500 MG PO TABS: ORAL_TABLET | ORAL | Status: DC

## 2013-05-29 MED ORDER — TRAMADOL HCL 50 MG PO TABS: 50.0000 mg | ORAL_TABLET | Freq: Three times a day (TID) | ORAL | Status: DC | PRN

## 2013-05-29 NOTE — Addendum Note (Signed)
Addended by: Susie Cassette MD, Germain Osgood on: 05/29/2013 02:13 PM   Modules accepted: Orders

## 2013-06-03 ENCOUNTER — Other Ambulatory Visit: Payer: Self-pay | Admitting: Internal Medicine

## 2013-06-03 ENCOUNTER — Ambulatory Visit (HOSPITAL_COMMUNITY)
Admission: RE | Admit: 2013-06-03 | Discharge: 2013-06-03 | Disposition: A | Discharge status: Home / Self Care | Payer: No Typology Code available for payment source | Source: Ambulatory Visit | Attending: Internal Medicine | Admitting: Internal Medicine

## 2013-06-03 DIAGNOSIS — M549 Dorsalgia, unspecified: Secondary | ICD-10-CM

## 2013-06-03 DIAGNOSIS — D259 Leiomyoma of uterus, unspecified: Secondary | ICD-10-CM | POA: Insufficient documentation

## 2013-06-03 DIAGNOSIS — N949 Unspecified condition associated with female genital organs and menstrual cycle: Secondary | ICD-10-CM | POA: Insufficient documentation

## 2013-06-23 ENCOUNTER — Ambulatory Visit: Payer: No Typology Code available for payment source | Attending: Internal Medicine | Admitting: Internal Medicine

## 2013-06-23 ENCOUNTER — Encounter: Payer: Self-pay | Admitting: Internal Medicine

## 2013-06-23 NOTE — Progress Notes (Signed)
Pt is here for a f/u of a ultrasound. Lft abdominal pain x2 days; felt like BP was elevated while sleeping, headaches. Pt has an interpreter.

## 2013-06-24 ENCOUNTER — Telehealth: Payer: Self-pay | Admitting: Emergency Medicine

## 2013-07-01 ENCOUNTER — Ambulatory Visit: Payer: No Typology Code available for payment source | Attending: Internal Medicine

## 2013-08-06 ENCOUNTER — Ambulatory Visit: Payer: No Typology Code available for payment source | Admitting: Internal Medicine

## 2013-08-10 ENCOUNTER — Ambulatory Visit: Payer: No Typology Code available for payment source | Attending: Internal Medicine | Admitting: Internal Medicine

## 2013-08-10 ENCOUNTER — Encounter: Payer: Self-pay | Admitting: Internal Medicine

## 2013-08-10 VITALS — BP 110/76 | HR 85 | Temp 98.1°F | Resp 16 | Ht <= 58 in | Wt 190.0 lb

## 2013-08-10 DIAGNOSIS — I10 Essential (primary) hypertension: Secondary | ICD-10-CM | POA: Insufficient documentation

## 2013-08-10 DIAGNOSIS — N920 Excessive and frequent menstruation with regular cycle: Secondary | ICD-10-CM | POA: Insufficient documentation

## 2013-08-10 DIAGNOSIS — D259 Leiomyoma of uterus, unspecified: Secondary | ICD-10-CM | POA: Insufficient documentation

## 2013-08-10 DIAGNOSIS — R109 Unspecified abdominal pain: Secondary | ICD-10-CM | POA: Insufficient documentation

## 2013-08-10 DIAGNOSIS — Z09 Encounter for follow-up examination after completed treatment for conditions other than malignant neoplasm: Secondary | ICD-10-CM | POA: Insufficient documentation

## 2013-08-10 NOTE — Progress Notes (Signed)
Pt is here to review her [place] results.

## 2013-08-10 NOTE — Progress Notes (Signed)
Patient ID: Darlene Salazar, female   DOB: [birth year], 43 y.o.   MRN: [id number]   Darlene Salazar, is a 43 y.o. female  MVH:[id number]  [id number]:[id number]  DOB - 1971-05-28  Chief Complaint  Patient presents with  . Follow-up        Subjective:   Darlene Salazar is a 43 y.o. female here today for a follow up visit. Patient was seen recently for low abdominal pain and menorrhagia. She was sent for ultrasound of the pelvis. Patient is here to review the results of the ultrasound. She has no new complaints, she still has the abdominal pain as well as menorrhagia. Pelvic ultrasound showed uterine fibroid. Patient has No headache, No chest pain, No abdominal pain - No Nausea, No new weakness tingling or numbness, No Cough - SOB.  No problems updated.  ALLERGIES: No Known Allergies  PAST MEDICAL HISTORY: Past Medical History  Diagnosis Date  . Hypertension     MEDICATIONS AT HOME: Prior to Admission medications   Medication Sig Start Date End Date Taking? Authorizing Provider  ferrous sulfate 325 (65 FE) MG tablet Take 1 tablet (325 mg total) by mouth daily with breakfast. 05/28/13  Yes Angelica Chessman, MD  acetaminophen (TYLENOL) 500 MG tablet Take 500 mg by mouth every 6 (six) hours as needed. For pain    Historical Provider, MD  methocarbamol (ROBAXIN) 500 MG tablet Take one in the morning, one in the afternoon, and 2 at bedtime as necessary for muscle relaxant 05/29/13   Reyne Dumas, MD  oxaprozin (DAYPRO) 600 MG tablet One twice daily for pain and inflammation 12/06/12   Posey Boyer, MD  traMADol (ULTRAM) 50 MG tablet Take 1 tablet (50 mg total) by mouth every 8 (eight) hours as needed. 05/29/13   Reyne Dumas, MD     Objective:   Filed Vitals:   08/10/13 1522  BP: 110/76  Pulse: 85  Temp: 98.1 F (36.7 C)  TempSrc: Oral  Resp: 16  Height: 4\' 10"  (1.473 m)  Weight: 190 lb (86.183 kg)  SpO2: 99%    Exam General appearance : Awake, alert, not  in any distress. Speech Clear. Not toxic looking HEENT: Atraumatic and Normocephalic, pupils equally reactive to light and accomodation Neck: supple, no JVD. No cervical lymphadenopathy.  Chest:Good air entry bilaterally, no added sounds  CVS: S1 S2 regular, no murmurs.  Abdomen: Mild suprapubic tenderness Bowel sounds present, with no gaurding, rigidity or rebound. Extremities: B/L Lower Ext shows no edema, both legs are warm to touch Neurology: Awake alert, and oriented X 3, CN II-XII intact, Non focal Skin:No Rash Wounds:N/A  Data Review  Assessment & Plan  Pelvic ultrasound report reviewed by me, discussed with patient  1. Uterine fibroid  - Ambulatory referral to Gynecology  Patient extensively counseled on nutrition and exercise  Return in about 3 months (around 11/07/2013), or if symptoms worsen or fail to improve, for Routine Follow Up, Pap Smear.  The patient was given clear instructions to go to ER or return to medical center if symptoms don't improve, worsen or new problems develop. The patient verbalized understanding. The patient was told to call to get lab results if they haven't heard anything in the next week.    Angelica Chessman, MD, [place], [place], [place] [facility] [place], [place]   08/10/2013, 4:26 PM

## 2013-08-10 NOTE — Patient Instructions (Signed)
Fibroma uterino (Uterine Fibroid) Un fibroma uterino es un crecimiento (tumor) dentro del tero. Este tipo de tumor no es canceroso y no se extiende fuera del tero. Podr tener uno o varios fibromas. Los fibromas pueden variar en tamao, peso y el lugar en que se desarrollan dentro del tero. Algunos pueden llegar a ser bastante grandes. La mayora de los fibromas no necesitan tratamiento mdico, pero algunos pueden causar dolor o sangrado abundante durante los perodos y entre ellos. CAUSAS  Un fibroma es el resultado del desarrollo continuo de una nica clula uterina que sigue creciendo (no regulada) que es diferente al resto de las clulas del cuerpo humano. La mayora de las clulas tiene un mecanismo de control que evita que se reproduzcan de manera descontrolada.  SIGNOS Y SNTOMAS   Hemorragias.  Dolor y sensacin de presin en la pelvis.  Problemas en la vejiga debido al tamao del fibroma.  Infertilidad y abortos espontneos, segn el tamao y la ubicacin del fibroma. DIAGNSTICO  Los fibromas uterinos se diagnostican con un examen fsico. El mdico puede palpar los tumores abultados al realizar el examen de la pelvis. Una ecografa puede indicarse para tener informacin del tamao, la ubicacin y el nmero de tumores.  TRATAMIENTO   El mdico puede considerar que es conveniente esperar y prestar atencin. Esto incluye el control del fibroma por parte del mdico para observar si crece o disminuye su tamao.  Podr indicarle un tratamiento hormonal o el uso de un dispositivo intrauterino (DIU).  En algunos casos es necesaria la ciruga para extirpar el fibroma (miomectoma) o el tero (histerectoma). Esto depender de su situacin. Cuando una mujer desea quedar embarazada y los fibromas interfieren en su fertilidad, el mdico puede recomendar la extirpacin del fibroma.  INSTRUCCIONES PARA EL CUIDADO EN EL HOGAR  Los cuidados en el hogar dependen del tratamiento que haya  recibido. En general:   Cumpla con todas las visitas de control, segn le indique su mdico.  Tome slo medicamentos de venta libre o recetados, segn las indicaciones del mdico. Si le recetaron un tratamiento hormonal, tome los medicamentos hormonales como le indicaron. No tome aspirina. Puede ocasionar hemorragias.  Consulte al mdico si debe tomar pldoras de hierro.  Si sus perodos son molestos pero no tan abundantes, acustese con los pies ligeramente elevados por encima del nivel del corazn. Coloque compresas fras en la zona inferior del abdomen.  Si sus perodos son muy abundantes, anote el nmero de compresas o tampones que usa cada mes. Lleve esta informacin a su consulta mdica.  Incluya vegetales verdes en su dieta. SOLICITE ATENCIN MDICA DE INMEDIATO SI:  Siente dolor o clicos en la pelvis y no puede controlarlos con los medicamentos.  El dolor en la pelvis aumenta de manera repentina.  Aumenta el sangrado entre los perodos o durante los mismos.  Si tiene perodos muy abundantes y debe cambiar un tampn o una toalla higinica cada media hora o menos.  Se siente mareado o tiene episodios de desmayo. Document Released: 06/04/2005 Document Revised: 03/25/2013 ExitCare Patient Information 2014 [employer].  

## 2013-08-12 NOTE — Progress Notes (Signed)
This encounter was created in error - please disregard.

## 2013-09-01 ENCOUNTER — Encounter: Payer: Self-pay | Admitting: Obstetrics & Gynecology

## 2013-10-12 ENCOUNTER — Ambulatory Visit (INDEPENDENT_AMBULATORY_CARE_PROVIDER_SITE_OTHER): Payer: No Typology Code available for payment source | Admitting: Obstetrics & Gynecology

## 2013-10-12 ENCOUNTER — Encounter: Payer: Self-pay | Admitting: Obstetrics & Gynecology

## 2013-10-12 VITALS — BP 129/85 | HR 82 | Temp 97.7°F | Ht <= 58 in | Wt 187.1 lb

## 2013-10-12 DIAGNOSIS — N920 Excessive and frequent menstruation with regular cycle: Secondary | ICD-10-CM

## 2013-10-12 DIAGNOSIS — N946 Dysmenorrhea, unspecified: Secondary | ICD-10-CM

## 2013-10-12 NOTE — Progress Notes (Signed)
Pt. Reports having pelvic pain with heavy bleeding during periods. Periods last 4-7 days.

## 2013-10-12 NOTE — Patient Instructions (Signed)
Colocacin de un dispositivo intrauterino - Cuidados posteriores (Intrauterine Device Insertion, Care After) Siga estas instrucciones durante las prximas semanas. Estas indicaciones le proporcionan informacin general acerca de cmo deber cuidarse despus del procedimiento. El mdico tambin podr darle instrucciones ms especficas. El tratamiento ha sido planificado segn las prcticas mdicas actuales, pero en algunos casos pueden ocurrir problemas. Comunquese con el mdico si tiene algn problema o tiene dudas despus del procedimiento. QU ESPERAR DESPUS DEL PROCEDIMIENTO La insercin del DIU puede causar molestias, como clicos. que deberan mejorar una vez que el DIU est en su lugar. Podr tener sangrado despus del procedimiento. Esto es normal. Vara desde un sangrado ligero durante un par de [employer] un sangrado similar al menstrual. Cuando el DIU est en su lugar, se extender un hilo de 1 a 2pulgadas (2,5 a 5cm) por el cuello del tero en la vagina. El hilo no debera molestarle a usted ni a su pareja. De lo contrario, consulte con su mdico.  INSTRUCCIONES PARA EL CUIDADO EN EL HOGAR   Controle su DIU para asegurarse de que est en su lugar, antes de reanudar la actividad sexual. Tiene que sentir los hilos. Si no los siente, algo puede estar mal. El DIU puede haberse salido del tero o ste puede haber sido atravesado (perforado) durante la colocacin. Adems, si los hilos son ms largos, puede significar que el DIU se est saliendo del tero. Si ocurre alguno de [employer], no estar protegida y podr Product/process development scientist.  Puede volver a Clinical biochemist si no tiene problemas con el DIU. El DIU de cobre se considera efectivo y funciona de inmediato, si se inserta dentro de los 7 das del inicio del perodo. Ser necesario que utilice un mtodo anticonceptivo adicional durante 7 [place], si el DIU se inserta en algn otro momento del ciclo.  Controle que el DIU sigue en su  lugar sintiendo los hilos despus de cada perodo menstrual.  Es posible que necesite tomar analgsicos, como acetaminofeno o ibuprofeno. Tome todos los medicamentos como le indic el mdico. SOLICITE ATENCIN MDICA SI:   Tiene un sangrado ms abundante o dura ms de un ciclo menstrual normal.  Tiene fiebre.  Siente clicos o dolor abdominal que no se alivian con medicamentos.  Siente dolor abdominal que no parece estar relacionado con el rea en que senta los clicos y Conservation officer, historic buildings anteriormente.  Se siente mareada, inusualmente dbil o se desmaya.  Tiene flujo vaginal u olores anormales.  Siente dolor durante las [employer].  No puede sentir los hilos del DIU o los siente ms largos.  Siente que el DIU est en la abertura del cuello del tero, en la vagina.  Piensa que est embarazada o no tiene su perodo menstrual.  El hilo del DIU est lastimando a su pareja sexual. ASEGRESE DE QUE:  Comprende estas instrucciones.  Controlar su afeccin.  Recibir ayuda de inmediato si no mejora o si empeora. Document Released: 02/27/2012 Document Revised: 03/25/2013 [facility] Patient Information 2014 [place], [place]. Levonorgestrel intrauterine device (IUD) Qu es este medicamento? El LEVONORGESTREL (DIU) es un dispositivo anticonceptivo (control de natalidad). El dispositivo se coloca dentro del tero por un profesional de la salud. Se utiliza para Therapist, occupational y tambin se puede Risk manager para tratar el sangrado abundante que ocurre durante su perodo. Dependiendo del dispositivo, se puede utilizar por 3 a 5 aos. Este medicamento puede ser utilizado para otros usos; si tiene alguna pregunta consulte con su proveedor de atencin mdica o con su  farmacutico. MARCAS COMERCIALES DISPONIBLES: Kennieth Rad le debo informar a mi profesional de la salud antes de tomar este medicamento? Necesita saber si usted presenta alguno de los siguientes problemas o  situaciones: -exmen de Papanicolaou anormal -cncer de mama, cuello del tero o tero -diabetes -endometritis -si tiene una infeccin plvica o genital actual o en el pasado -tiene ms de una pareja sexual o si su pareja tiene ms de una pareja -enfermedad cardiaca -antecedente de embarazo tubrico o ectpico -problemas del sistema inmunolgico -DIU colocado -enfermedad heptica o tumor del hgado -problemas con la coagulacin o si toma diluyentes sanguneos -[place] medicamentos intravenoso -forma inusual del tero -sangrado vaginal que no tiene explicacin -una reaccin alrgica o inusual al levonorgestrel, a otras hormonas, a la silicona o polietilenos, a otros medicamentos, alimentos, colorantes o conservantes -si est embarazada o buscando quedar embarazada -si est amamantando a un beb Cmo debo utilizar este medicamento? Un profesional de Estate agent este dispositivo en el tero. Hable con su pediatra para informarse acerca del uso de este medicamento en nios. Puede requerir atencin especial. Sobredosis: Pngase en contacto inmediatamente con un centro toxicolgico o una sala de urgencia si usted cree que haya tomado demasiado medicamento. ATENCIN: [employer] es solo para usted. No comparta este medicamento con nadie. Qu sucede si me olvido de una dosis? No se aplica en este caso. Qu puede interactuar con este medicamento? No tome esta medicina con ninguno de los siguientes medicamentos: -amprenavir -bosentano -fosamprenavir Esta medicina tambin puede interactuar con los siguientes medicamentos: -aprepitant -barbitricos para producir el sueo o para el tratamiento de convulsiones -bexaroteno -griseofulvina -medicamentos para tratar los convulsiones, tales como [place], [place], [place], [place], [place], topiramato -modafinilo -pioglitazona -rifabutina -rifampicina -rifapentina -algunos medicamentos para tratar el virus VIH, tales  como atazanavir, indinavir, lopinavir, nelfinavir, tipranavir, ritonavir -hierba de [place] -warfarina Puede ser que esta lista no menciona todas las posibles interacciones. Informe a su profesional de [employer]	[place] de [employer] productos a base de hierbas, medicamentos de [place] o suplementos nutritivos que est tomando. Si usted fuma, consume bebidas alcohlicas o si utiliza drogas ilegales, indqueselo tambin a su profesional de [employer]	[place]. Algunas sustancias pueden interactuar con su medicamento. A qu debo estar atento al usar [employer]? Visite a su mdico o a su profesional de la salud para chequear su evolucin peridicamente. Visite a su mdico si usted o su pareja tiene relaciones sexuales con [employer], se vuelve VIH positivo o contrae una enfermedad de transmisin sexual. Este medicamento no la protege de la infeccin por VIH (SIDA) ni de ninguna otra enfermedad de transmisin sexual. Puede controlar la ubicacin del DIU usted misma palpando con sus dedos limpios los hilos en la parte anterior de la vagina. No tire de los hilos. Es un buen hbito controlar la ubicacin del dispositivo despus de cada perodo menstrual. Si no slo siente los hilos sino que adems siente otra parte ms del DIU o si no puede sentir los hilos, consulte a su mdico inmediatamente. El DIU puede salirse por s solo. Puede quedar embarazada si el dispositivo se sale de Chief of Staff. Utilice un mtodo anticonceptivo adicional, como preservativos, y consulte a su proveedor de atencin mdica s observa que el DIU se sali de Chief of Staff. La utilizacin de tampones no cambia la posicin del DIU y no hay inconvenientes en usarlos durante su perodo. Qu efectos secundarios puedo tener al [employer] este medicamento? Efectos secundarios que debe informar a su mdico o a Barrister's clerk de  la salud tan pronto como sea posible: -Chief of Staff como erupcin cutnea, picazn o urticarias, hinchazn de la cara,  labios o lengua -fiebre, sntomas gripales -llagas genitales -alta presin sangunea -ausencia de un perodo menstrual durante 6 semanas mientras lo utiliza -Social research officer, government, Occupational hygienist en las piernas -dolor o sensibilidad del plvico -dolor de cabeza repentino o severo -signos de Media planner -calambres estomacales -falta de aliento repentina -problemas de coordinacin, del habla, al caminar -sangrado, flujo vaginal inusual -color amarillento de los ojos o la piel Efectos secundarios que, por lo general, no requieren atencin mdica (debe informarlos a su mdico o a su profesional de la salud si persisten o si son molestos): -acn -dolor de pecho -cambios en el deseo sexual o capacidad -cambios de peso -calambres, Tree surgeon o sensacin de [employer] se introduce el dispositivo -dolor de cabeza -sangrado menstruales irregulares en los primeros 3 a 6 meses de usar -nuseas Puede ser que esta lista no menciona todos los posibles efectos secundarios. Comunquese a su mdico por asesoramiento mdico [employer]. Usted puede informar los efectos secundarios a la FDA por telfono al 1-800-FDA-1088. Dnde debo guardar mi medicina? No se aplica en este caso. ATENCIN: Este folleto es un resumen. Puede ser que no cubra toda la posible informacin. Si usted tiene preguntas acerca de esta medicina, consulte con su mdico, su farmacutico o su profesional de Technical sales engineer.  2014, Elsevier/Gold Standard. (2011-07-24 16:57:41)

## 2013-10-12 NOTE — Progress Notes (Signed)
Subjective:     Patient ID: Darlene Salazar, female   DOB: 04/12/1971, 43 y.o.   MRN: [id number]  HPI 43yo [id number] female with h/o heavy vaginal bleeding and cramps with her menses. She was told that her pain was related to a fibroid.  She denies bleeding between her menses.  Her pain occurs with her menses.   Past Medical History  Diagnosis Date  . Hypertension    Past Surgical History  Procedure Laterality Date  . Cholecystectomy      .  No Known Allergies Current Outpatient Prescriptions on File Prior to Visit  Medication Sig Dispense Refill  . acetaminophen (TYLENOL) 500 MG tablet Take 500 mg by mouth every 6 (six) hours as needed. For pain      . ferrous sulfate 325 (65 FE) MG tablet Take 1 tablet (325 mg total) by mouth daily with breakfast.  180 tablet  3   No current facility-administered medications on file prior to visit.    Review of Systems     Objective:   Physical Exam BP 129/85  Pulse 82  Temp(Src) 97.7 F (36.5 C) (Oral)  Ht 4\' 10"  (1.473 m)  Wt 187 lb 1.6 oz (84.868 kg)  BMI 39.11 kg/m2  LMP 10/05/2013 Pt in NAD Abd: obese; NT, ND GU: EGBUS: no lesions Vagina: no blood in vault Cervix: no lesion; no mucopurulent d/c Uterus: 10 weeks sized, mobile Adnexa: no masses; non tender        06/03/2013 EXAM:  TRANSABDOMINAL AND TRANSVAGINAL ULTRASOUND OF PELVIS  TECHNIQUE:  Both transabdominal and transvaginal ultrasound examinations of the  pelvis were performed. Transabdominal technique was performed for  global imaging of the pelvis including uterus, ovaries, adnexal  regions, and pelvic cul-de-sac. It was necessary to proceed with  endovaginal exam following the transabdominal exam to visualize the  ovaries and endometrium.  COMPARISON: 08/18/2009  FINDINGS:  Uterus  Measurements: 10.6 x 6 x 7.1 cm. There is a 2.8 x 2.4 x 2.4 cm  heterogeneously hypoechoic posterior uterine mass most consistent  with a leiomyoma.  Endometrium   Thickness: 16.6 mm. No focal abnormality visualized.  Right ovary  Measurements: 3 x 1.9 x 2.9 cm. There is no adnexal mass. There is a  dominant right ovarian follicle measuring 1.8 x 1.7 x 1.4 cm. Normal  arterial and venous Doppler waveforms are demonstrated in the right  ovary.  Left ovary  The left ovary is not visualized.  Other findings  No free fluid.  IMPRESSION:  1. There is a 2.8 cm uterine leiomyoma.  2. Nonvisualized left ovary.  3. Mild relative thickening of the endometrium measuring 16.6 mm.  Correlate with phase of the menstrual cycle.       Assessment:     Dysmenorrhea and menorrhagia- pt given her options on treatment.  She requests Mirena IUD    Plan:     Pt to fill out grant paperwork and f/u for IUD

## 2013-11-12 ENCOUNTER — Other Ambulatory Visit (HOSPITAL_COMMUNITY)
Admission: RE | Admit: 2013-11-12 | Discharge: 2013-11-12 | Disposition: A | Discharge status: Home / Self Care | Payer: No Typology Code available for payment source | Source: Ambulatory Visit | Attending: Internal Medicine | Admitting: Internal Medicine

## 2013-11-12 ENCOUNTER — Encounter: Payer: Self-pay | Admitting: Internal Medicine

## 2013-11-12 ENCOUNTER — Ambulatory Visit: Payer: No Typology Code available for payment source | Attending: Internal Medicine | Admitting: Internal Medicine

## 2013-11-12 VITALS — BP 113/76 | HR 87 | Temp 99.3°F | Resp 16 | Ht <= 58 in | Wt 187.2 lb

## 2013-11-12 DIAGNOSIS — Z1151 Encounter for screening for human papillomavirus (HPV): Secondary | ICD-10-CM | POA: Insufficient documentation

## 2013-11-12 DIAGNOSIS — Z1272 Encounter for screening for malignant neoplasm of vagina: Secondary | ICD-10-CM | POA: Insufficient documentation

## 2013-11-12 DIAGNOSIS — N898 Other specified noninflammatory disorders of vagina: Secondary | ICD-10-CM

## 2013-11-12 DIAGNOSIS — N760 Acute vaginitis: Secondary | ICD-10-CM | POA: Insufficient documentation

## 2013-11-12 DIAGNOSIS — D219 Benign neoplasm of connective and other soft tissue, unspecified: Secondary | ICD-10-CM

## 2013-11-12 DIAGNOSIS — Z01419 Encounter for gynecological examination (general) (routine) without abnormal findings: Secondary | ICD-10-CM | POA: Insufficient documentation

## 2013-11-12 DIAGNOSIS — Z124 Encounter for screening for malignant neoplasm of cervix: Secondary | ICD-10-CM

## 2013-11-12 DIAGNOSIS — Z113 Encounter for screening for infections with a predominantly sexual mode of transmission: Secondary | ICD-10-CM | POA: Insufficient documentation

## 2013-11-12 DIAGNOSIS — D259 Leiomyoma of uterus, unspecified: Secondary | ICD-10-CM

## 2013-11-12 NOTE — Progress Notes (Signed)
Patient ID: Darlene Salazar, female   DOB: [birth year], 43 y.o.   MRN: [id number]  CC: pap smear  HPI:  Patient presents to clinic today for Pap smear. Patient reports that she has noticed small bones on the inside of her labia that have been there for the past 8 months.  Patient reports the bumps get larger around her menstrual cycle time. Patient reports she only noticed it when she touched it. Patient denies pain, itch, or discharge. Patient reports that she has an appointment in June for Mirena placement to help control pain from uterine fibroids. Patient reports she was diagnosed with uterine fibroids approximately 4 months ago.   No Known Allergies Past Medical History  Diagnosis Date  . Hypertension    Current Outpatient Prescriptions on File Prior to Visit  Medication Sig Dispense Refill  . acetaminophen (TYLENOL) 500 MG tablet Take 500 mg by mouth every 6 (six) hours as needed. For pain      . ferrous sulfate 325 (65 FE) MG tablet Take 1 tablet (325 mg total) by mouth daily with breakfast.  180 tablet  3   No current facility-administered medications on file prior to visit.   Family History  Problem Relation Age of Onset  . Diabetes Father    History   Social History  . Marital Status: Married    Spouse Name: N/A    Number of Children: N/A  . Years of Education: N/A   Occupational History  . Not on file.   Social History Main Topics  . Smoking status: Never Smoker   . Smokeless tobacco: Never Used  . Alcohol Use: No  . Drug Use: No  . Sexual Activity: Yes    Birth Control/ Protection: None   Other Topics Concern  . Not on file   Social History Narrative   ** Merged History Encounter **        Review of Systems: Constitutional: Negative for fever, chills, diaphoresis, activity change, appetite change and fatigue. HENT: Negative for ear pain, nosebleeds, congestion, facial swelling, rhinorrhea, neck pain, neck stiffness and ear discharge.  Eyes: Negative  for pain, discharge, redness, itching and visual disturbance. Respiratory: Negative for cough, choking, chest tightness, shortness of breath, wheezing and stridor.  Cardiovascular: Negative for chest pain, palpitations and leg swelling. Gastrointestinal: Negative for abdominal distention. Genitourinary: Negative for dysuria, urgency, frequency, hematuria, flank pain, decreased urine volume, difficulty urinating and dyspareunia. Positive for small bumps on labia Musculoskeletal: Negative for back pain, joint swelling, arthralgias and gait problem. Neurological: Negative for dizziness, tremors, seizures, syncope, facial asymmetry, speech difficulty, weakness, light-headedness, numbness and headaches.  Hematological: Negative for adenopathy. Does not bruise/bleed easily. Psychiatric/Behavioral: Negative for hallucinations, behavioral problems, confusion, dysphoric mood, decreased concentration and agitation.    Objective:   Filed Vitals:   11/12/13 1453  BP: 113/76  Pulse: 87  Temp: 99.3 F (37.4 C)  Resp: 16   Physical Exam  Vitals reviewed. Constitutional: She is oriented to person, place, and time. She appears well-nourished. No distress.  Cardiovascular: Normal rate, regular rhythm and normal heart sounds.   Pulmonary/Chest: Effort normal and breath sounds normal.  Abdominal: Soft. Bowel sounds are normal. She exhibits no distension and no mass. There is no tenderness.  Genitourinary: Uterus normal.    There is lesion on the right labia. There is lesion on the left labia. There is no tenderness on the left labia. Cervix exhibits no motion tenderness, no discharge and no friability. Right adnexum displays no mass and  no tenderness. Left adnexum displays no mass and no tenderness. No erythema or tenderness around the vagina.  Neurological: She is alert and oriented to person, place, and time.  Skin: Skin is warm and dry. No rash noted.  Psychiatric: She has a normal mood and affect.       Lab Results  Component Value Date   WBC 10.8* 05/26/2013   HGB 11.4* 05/26/2013   HCT 34.4* 05/26/2013   MCV 75.4* 05/26/2013   PLT 440* 05/26/2013   Lab Results  Component Value Date   CREATININE 0.64 05/26/2013   BUN 9 05/26/2013   NA 135 05/26/2013   K 4.1 05/26/2013   CL 101 05/26/2013   CO2 28 05/26/2013    No results found for this basename: HGBA1C   Lipid Panel     Component Value Date/Time   CHOL 241* 08/03/2010 2058   TRIG 206* 08/03/2010 2058   HDL 48 08/03/2010 2058   CHOLHDL 5.0 Ratio 08/03/2010 2058   VLDL 41* 08/03/2010 2058   LDLCALC 152* 08/03/2010 2058       Assessment and plan:   Darlene Salazar was seen today for follow-up.  Diagnoses and associated orders for this visit:  Papanicolaou smear - Cytology - PAP [place] - Cervicovaginal ancillary only - HIV Antibody - RPR  Fibroids Continue with IUD placement  Vaginal cyst - Ambulatory referral to Gynecology   Return if symptoms worsen or fail to improve.       Lance Bosch, [place] [facility] [phone] 11/17/2013, 11:57 AM

## 2013-11-12 NOTE — Progress Notes (Signed)
Pt is here today to have a Pap smear. Pt reports having chronic pain in her right side of her back. Pt has an interpreter.

## 2013-11-13 ENCOUNTER — Telehealth: Payer: Self-pay | Admitting: Emergency Medicine

## 2013-11-13 LAB — HIV ANTIBODY (ROUTINE TESTING W REFLEX): HIV 1&2 Ab, 4th Generation: NONREACTIVE

## 2013-11-13 LAB — RPR

## 2013-11-13 MED ORDER — METRONIDAZOLE 500 MG PO TABS: 500.0000 mg | ORAL_TABLET | Freq: Two times a day (BID) | ORAL | Status: DC

## 2013-11-13 NOTE — Telephone Encounter (Signed)
Pt given std results. Informed she is positive for BV- needs to take prescribed Flagyl 500 mg BID x 7 dys with restrictions on Alcohol use. Pt verbalized understanding per pacific interpretor Script sent to [facility]

## 2013-11-13 NOTE — Telephone Encounter (Signed)
Message copied by Ricci Barker on Fri Nov 13, 2013  5:29 PM ------      Message from: Chari Manning A      Created: Fri Nov 13, 2013  4:41 PM       Let patient know negative STD, HIV, and Syphillis test. Let her know she was positive for BV-explain this is not STD. May send patient Metronidazole 500mg  BID for 7 days. Educate to avoid alcohol while on this medication. Thanks. ------

## 2013-11-18 ENCOUNTER — Telehealth: Payer: Self-pay | Admitting: Emergency Medicine

## 2013-11-18 NOTE — Telephone Encounter (Signed)
Pt given pap smear results per daughter translator. Daughter verbalized understanding

## 2013-11-18 NOTE — Telephone Encounter (Signed)
Message copied by Ricci Barker on Wed Nov 18, 2013  4:34 PM ------      Message from: Chari Manning A      Created: Tue Nov 17, 2013  6:10 PM       Let patient know pap is normal, she will not need another repeat pap for 3 years. Thanks. ------

## 2013-11-19 ENCOUNTER — Telehealth: Payer: Self-pay | Admitting: Internal Medicine

## 2013-11-19 ENCOUNTER — Ambulatory Visit: Payer: Self-pay | Admitting: Obstetrics & Gynecology

## 2013-11-19 NOTE — Telephone Encounter (Signed)
Pt calling because she is confused about antibiotic script. Says she finished the script that she was orginally prescribed and then received call to pick up new script and does not know what the second antibiotic script is for. Please f/u with pt to clarify.

## 2013-11-20 NOTE — Telephone Encounter (Signed)
Interpreter line used Patient not available Left message on voice mail to return our call

## 2013-11-26 ENCOUNTER — Encounter: Payer: Self-pay | Admitting: Obstetrics & Gynecology

## 2013-12-02 ENCOUNTER — Encounter (INDEPENDENT_AMBULATORY_CARE_PROVIDER_SITE_OTHER): Payer: Self-pay | Admitting: Obstetrics & Gynecology

## 2013-12-02 ENCOUNTER — Encounter: Payer: Self-pay | Admitting: Obstetrics & Gynecology

## 2013-12-02 VITALS — BP 124/86 | HR 102 | Temp 97.2°F | Ht <= 58 in | Wt 187.6 lb

## 2013-12-02 DIAGNOSIS — Z01812 Encounter for preprocedural laboratory examination: Secondary | ICD-10-CM

## 2013-12-02 NOTE — Progress Notes (Signed)
Patient ID: Darlene Salazar, female   DOB: 04-28-1971, 43 y.o.   MRN: [id number] Pt not seen by physician

## 2013-12-02 NOTE — Progress Notes (Signed)
Free mirena not here and paperwork isn't either. Patient states she filled out the paperwork for it but never received anything in the mail. Will fill out IUD scholarship

## 2013-12-31 ENCOUNTER — Encounter: Payer: No Typology Code available for payment source | Admitting: Obstetrics & Gynecology

## 2014-01-01 ENCOUNTER — Ambulatory Visit: Payer: No Typology Code available for payment source | Attending: Internal Medicine

## 2014-03-10 ENCOUNTER — Ambulatory Visit: Payer: No Typology Code available for payment source | Attending: Internal Medicine

## 2014-04-07 ENCOUNTER — Telehealth: Payer: Self-pay | Admitting: Obstetrics and Gynecology

## 2014-04-07 ENCOUNTER — Telehealth: Payer: Self-pay

## 2014-04-07 NOTE — Telephone Encounter (Signed)
Attempted to contact patient with pacific interpreter Jesus [phone]. Called mobile number. No answer. No voicemail box set up-- unable to leave message. Called home phone and was able to reach patient. Informed patient we are still awaiting her proof of income to send along with application for approval. Patient states she had received a letter stating she was not approved for Mirena and that she should re-sumbit the paperwork. Informed patient that we had not received her proof of income and therefore never sent out application-- advised that she bring in proof of income and we will then fax application and await approval. Patient states she brought in paperwork twice and was told she they would not suffice-- then admits that she just does not want the Mirena. Informed patient that she certainly does not have to have Mirena. Patient requests appointment to discuss other options. Informed patient I would send her information to front office staff and they will contact her with an appointment. Patient verbalized understanding and gratitude. No questions or concerns. Message sent to admin pool to call patient with appointment.

## 2014-04-07 NOTE — Telephone Encounter (Signed)
Confirmed appointment with patient after verifying identity.

## 2014-04-19 ENCOUNTER — Encounter: Payer: Self-pay | Admitting: Obstetrics & Gynecology

## 2014-05-21 ENCOUNTER — Encounter: Payer: Self-pay | Admitting: Nurse Practitioner

## 2014-05-21 ENCOUNTER — Ambulatory Visit (INDEPENDENT_AMBULATORY_CARE_PROVIDER_SITE_OTHER): Payer: Self-pay | Admitting: Nurse Practitioner

## 2014-05-21 VITALS — BP 107/72 | HR 81 | Temp 97.8°F | Ht <= 58 in | Wt 186.0 lb

## 2014-05-21 DIAGNOSIS — N946 Dysmenorrhea, unspecified: Secondary | ICD-10-CM | POA: Insufficient documentation

## 2014-05-21 DIAGNOSIS — F419 Anxiety disorder, unspecified: Secondary | ICD-10-CM

## 2014-05-21 LAB — POCT PREGNANCY, URINE: Preg Test, Ur: NEGATIVE

## 2014-05-21 MED ORDER — NORETHINDRONE 0.35 MG PO TABS: 1.0000 | ORAL_TABLET | Freq: Every day | ORAL | Status: DC

## 2014-05-21 MED ORDER — SERTRALINE HCL 50 MG PO TABS: 50.0000 mg | ORAL_TABLET | Freq: Every day | ORAL | Status: DC

## 2014-05-21 NOTE — Progress Notes (Signed)
Used Interpreter Avelino Leeds

## 2014-05-21 NOTE — Progress Notes (Signed)
History:  Darlene Salazar is a 43 y.o. [phone] who presents to [facility]  clinic today for follow up on dysmenorrhea which has been ongoing for years. She was diagnosed with a fibroid and advised to have [place] IUD inserted. She is fearful that it may cause her pains. She is sexually active with no birth control. She does not want Depo due to weight gain in past with depo. She has a history of migraine, obesity, hypertension at times and advanced age. She is not a candidate for estrogen containing BCPs. She is also complaining of anxiety related to her husband and that has been ongoing for one year. She would like to take a medication.    The following portions of the patient's history were reviewed and updated as appropriate: allergies, current medications, past family history, past medical history, past social history, past surgical history and problem list.  Review of Systems:    Objective:  Physical Exam BP 107/72 mmHg  Pulse 81  Temp(Src) 97.8 F (36.6 C)  Ht 4\' 9"  (1.448 m)  Wt 186 lb (84.369 kg)  BMI 40.24 kg/m2  LMP 05/16/2014 GENERAL: Well-developed, well-nourished female in no acute distress. Morbid Obesity HEENT: Normocephalic, atraumatic.  NECK: Supple. Normal thyroid.  EXTREMITIES: No cyanosis, clubbing, or edema, 2+ distal pulses.   Labs and Imaging No results found.  Assessment & Plan:  Assessment: Dysmenorrhea Anxiety Morbid Obesity  Plans: After lengthy discussion concerning birth control options we will use Micronor daily # 11 refills Zoloft 50 mg po qday RTC 3 months for review of medication management  Olegario Messier, NP 05/21/2014 11:24 AM

## 2014-05-21 NOTE — Patient Instructions (Signed)
Dismenorrea  (Dysmenorrhea)  Los cólicos menstruales (dismenorrea) se originan en los espasmos musculares del útero (contracciones) durante el período menstrual. En algunas mujeres, el malestar es sólo una molestia. En otras, la dismenorrea puede ser tan intensa que interfiere con las actividades diarias durante algunos días, todos los meses.  La dismenorrea primaria son los cólicos menstruales que duran algunos días al inicio de los períodos o poco después. En general se inician después de que la adolescente comienza a tener sus períodos. A medida que la mujer avanza en edad, o tiene un bebé, los cólicos generalmente disminuyen o desaparecen. La dismenorrea secundaria comienza en épocas posteriores de la vida, dura más tiempo, y el dolor puede ser más intenso. El dolor puede comenzar antes del período y durar hasta algunos días después.   CAUSAS   La causa de la dismenorrea generalmente es un problema subyacente, por ejemplo:  · El tejido que recubre interiormente al útero crece fuera del útero en otras áreas del cuerpo (endometriosis).  · The endometrial tissue, which normally lines the uterus, is found in or grows into the muscular walls of the uterus (adenomyosis).  · Los vasos sanguíneos de la pelvis se congestionan con sangre antes del período menstrual (síndrome congestivo pélvico).  · Crecimiento excesivo de las células (pólipos) del revestimiento interior del útero o del cuello uterino.  · Caída del útero (prolapso) debido a distensión o flojedad de los ligamentos.  · Depresión.  · Problemas como infección o inflamación en la vejiga.  · Problemas en el intestino, un tumor, o síndrome de colon irritable.  · Cáncer de los órganos femeninos o en la vejiga.  · Un útero muy inclinado.  · Una apertura muy estrecha o el cuello uterino cerrado.  · Tumores no cancerosos en el útero (fibromas).  · Enfermedad pélvica inflamatoria (EPI)  · Cicatrices en la pelvis (adherencias) por cirugías previas.  · Quiste  ovárico  · El uso del dispositivo intrauterino (DIU) como método anticonceptivo.  FACTORES DE RIESGO  Puede tener más riesgo de dismenorrea si:  · Tiene menos de 30 años.  · La pubertad se inició temprano.  · Tiene sangrado irregular o abundante.  · No ha tenido hijos.  · Tiene una historia familiar de este problema.  · Es fumadora.  SIGNOS Y SÍNTOMAS   · Cólicos y dolor punzante en la zona inferior del abdomen.  · Dolores de cabeza.  · Dolor de cintura.  · Náuseas o vómitos.  · Diarrea.  · Sudoración o mareos.  · Heces blandas.  DIAGNÓSTICO   El diagnóstico se basa en la historia personal, los síntomas, el examen físico, las pruebas diagnósticas o procedimientos. Las pruebas diagnósticas o procedimientos pueden ser:  · Análisis de sangre.  · Ecografías.  · Examen de las capas que cubren el útero (dilatación y curetaje, D&C).  · Examen de la zona interna del abdomen o la pelvis con un laparoscopio.  · Radiografías.  · Tomografía computada.  · Resonancia magnética.  · Un examen del interior de la vejiga con un citoscopio.  · Examen del interior del intestino o el estómago con un dispositivo especial (colonoscopio o un gastroscopio).  TRATAMIENTO   El tratamiento depende de la causa de la dismenorrea. El tratamiento puede incluir:  · Analgésicos indicados por su médico.  · Píldoras anticonceptivas o un DIU con progesterona.  · Terapia de reemplazo hormonal.  · Antiinflamatorios no esteroides (AINE). Los antiinflamatorios pueden detener la producción de prostaglandinas.  · Cirugía para extirpar   psicoterapia individual o de  grupo.  Actividad fsica y fisioterapia.  Meditacin y yoga.  Acupuntura. [place] slo medicamentos de venta libre o recetados, segn las indicaciones del mdico.  Coloque una almohadilla elctrica o una botella con agua caliente en la zona inferior del abdomen. No duerma con la almohadilla trmica.  Haga ejercicios aerbicos como caminar, nadar, andar en bicicleta, para [employer].  Masajear la zona inferior de la espalda o el abdomen puede ser de [place].  Deje de fumar.  Evite la cafena y el alcohol. SOLICITE ATENCIN MDICA SI:   El dolor no mejora con los medicamentos recetados.  Tiene dolor durante las [employer].  El dolor aumenta y no puede controlarlo con los medicamentos.  Observa una hemorragia vaginal anormal con el perodo.  Tiene nuseas o vmitos con el perodo menstrual que no puede controlar con medicamentos. SOLICITE ATENCIN MDICA DE INMEDIATO SI:  Se desmaya.  Document Released: 03/14/2005 Document Revised: 02/04/2013 [facility] Patient Information 2015 [place]. This information is not intended to replace advice given to you by your health care provider. Make sure you discuss any questions you have with your health care provider. Trastorno de ansiedad generalizada (Generalized Anxiety Disorder) El trastorno de ansiedad generalizada es un trastorno mental. Interfiere en las funciones vitales, incluyendo las [place], el trabajo y la escuela.  Es diferente de la ansiedad normal que todas las personas experimentan en algn momento de su vida en respuesta a sucesos y Chief Executive Officer. En verdad, la ansiedad normal nos ayuda a prepararnos y Brewing technologist acontecimientos y actividades de la vida. La ansiedad normal desaparece despus de que el evento o la actividad ha finalizado.  El trastorno de ansiedad generalizada no est necesariamente relacionada con eventos o actividades  especficas. Tambin causa un exceso de ansiedad en proporcin a sucesos o actividades especficas. En este trastorno la ansiedad es difcil de Chief Technology Officer. Los sntomas pueden variar de leves a muy graves. Las personas que sufren de trastorno de ansiedad generalizada pueden tener intensas olas de ansiedad con sntomas fsicos (ataques de pnico).  SNTOMAS  La ansiedad y la preocupacin asociada a este trastorno son difciles de Chief Technology Officer. Esta ansiedad y la preocupacin estn relacionados con muchos eventos de la vida y sus actividades y tambin ocurre durante ms [employer] que no ocurre, durante 6 meses o ms. Las personas que la sufren pueden tener tres o ms de los siguientes sntomas (uno o ms en los nios):   Customer service manager.  Dificultades de concentracin.   Irritabilidad.  Tensin muscular  Dificultad para dormirse o sueo poco satisfactorio. DIAGNSTICO  Se diagnostica a travs de una evaluacin realizada por el mdico. El mdico le har preguntas acerca de su estado de nimo, sntomas fsicos y sucesos de [place] vida. Le har preguntas sobre su historia clnica, el consumo de alcohol o drogas, incluyendo los medicamentos recetados. [employer] un examen fsico e indicar anlisis de [place]. Ciertas enfermedades y el uso de determinadas sustancias pueden causar sntomas similares a este trastorno. Su mdico lo puede derivar a Teaching laboratory technician en salud mental para una evaluacin ms profunda.Belva Crome  Las terapias siguientes se utilizan en el tratamiento de este trastorno:   Medicamentos - Se recetan antidepresivos para el control diario a Barrister's clerk. Pueden indicarse tambin medicamentos para combatir la [place] graves, especialmente cuando ocurren ataques de pnico.   Terapia conversada (psicoterapia) Ciertos tipos de psicoterapia  pueden ser tiles en el tratamiento del trastorno de ansiedad generalizada, proporcionando apoyo, educacin y orientacin. Una  forma de psicoterapia llamada terapia cognitivo-conductual puede ensearle formas saludables de pensar y Firefighter a los eventos y actividades de la vida diaria.  Tcnicasde manejo del estrs- Estas tcnicas incluyen el yoga, la meditacin y el ejercicio y pueden ser muy tiles cuando se practican con regularidad. Un especialista en salud mental puede ayudar a determinar qu tratamiento es mejor para usted. Algunas personas obtienen mejora con una terapia. Sin embargo, Producer, television/film/video requieren una combinacin de terapias.  Document Released: 09/29/2012 Document Revised: 10/19/2013 [facility] Patient Information 2015 [place]. This information is not intended to replace advice given to you by your health care provider. Make sure you discuss any questions you have with your health care provider.

## 2014-08-30 ENCOUNTER — Ambulatory Visit: Payer: Self-pay | Attending: Internal Medicine | Admitting: Internal Medicine

## 2014-08-30 ENCOUNTER — Encounter: Payer: Self-pay | Admitting: Internal Medicine

## 2014-08-30 VITALS — BP 107/71 | HR 80 | Temp 98.4°F | Resp 16 | Ht <= 58 in | Wt 190.0 lb

## 2014-08-30 DIAGNOSIS — R51 Headache: Secondary | ICD-10-CM | POA: Insufficient documentation

## 2014-08-30 DIAGNOSIS — R519 Headache, unspecified: Secondary | ICD-10-CM

## 2014-08-30 LAB — LIPID PANEL
Cholesterol: 243 mg/dL — ABNORMAL HIGH (ref 0–200)
HDL: 36 mg/dL — AB (ref >=46)
LDL CALC: 168 mg/dL — AB (ref 0–99)
Total CHOL/HDL Ratio: 6.8 Ratio
Triglycerides: 194 mg/dL — ABNORMAL HIGH (ref <150)
VLDL: 39 mg/dL (ref 0–40)

## 2014-08-30 LAB — COMPLETE METABOLIC PANEL WITH GFR
ALBUMIN: 4.2 g/dL (ref 3.5–5.2)
ALK PHOS: 77 U/L (ref 39–117)
ALT: 12 U/L (ref 0–35)
AST: 13 U/L (ref 0–37)
BUN: 11 mg/dL (ref 6–23)
CALCIUM: 9.1 mg/dL (ref 8.4–10.5)
CHLORIDE: 103 meq/L (ref 96–112)
CO2: 25 mEq/L (ref 19–32)
Creat: 0.61 mg/dL (ref 0.50–1.10)
GFR, Est African American: >89 mL/min
GFR, Est Non African American: >89 mL/min
Glucose, Bld: 96 mg/dL (ref 70–99)
POTASSIUM: 4.7 meq/L (ref 3.5–5.3)
SODIUM: 137 meq/L (ref 135–145)
TOTAL PROTEIN: 7 g/dL (ref 6.0–8.3)
Total Bilirubin: 0.3 mg/dL (ref 0.2–1.2)

## 2014-08-30 LAB — CBC
HCT: 35.7 % — ABNORMAL LOW (ref 36.0–46.0)
Hemoglobin: 11.5 g/dL — ABNORMAL LOW (ref 12.0–15.0)
MCH: 24.3 pg — ABNORMAL LOW (ref 26.0–34.0)
MCHC: 32.2 g/dL (ref 30.0–36.0)
MCV: 75.5 fL — ABNORMAL LOW (ref 78.0–100.0)
MPV: 9.6 fL (ref 8.6–12.4)
PLATELETS: 434 10*3/uL — AB (ref 150–400)
RBC: 4.73 MIL/uL (ref 3.87–5.11)
RDW: 16.3 % — ABNORMAL HIGH (ref 11.5–15.5)
WBC: 10.8 10*3/uL — AB (ref 4.0–10.5)

## 2014-08-30 LAB — HEMOGLOBIN A1C
Hgb A1c MFr Bld: 6 % — ABNORMAL HIGH (ref <5.7)
Mean Plasma Glucose: 126 mg/dL — ABNORMAL HIGH (ref <117)

## 2014-08-30 LAB — TSH: TSH: 2.769 u[IU]/mL (ref 0.350–4.500)

## 2014-08-30 MED ORDER — CYCLOBENZAPRINE HCL 10 MG PO TABS: 10.0000 mg | ORAL_TABLET | Freq: Three times a day (TID) | ORAL | Status: DC | PRN

## 2014-08-30 MED ORDER — PREDNISONE (PAK) 10 MG PO TABS: ORAL_TABLET | Freq: Every day | ORAL | Status: DC

## 2014-08-30 NOTE — Progress Notes (Signed)
Pt is here c/o frequent headaches that last over a week and the pain radiates to her eyes and they become painful. Pt reports having numbness in her left leg. Pt has an interpreter.

## 2014-08-30 NOTE — Patient Instructions (Signed)
Cefalea migraosa (Migraine Headache) Una cefalea migraosa es un dolor intenso y punzante en uno o ambos lados de la cabeza. La migraa puede durar desde 30 minutos hasta varias horas. CAUSAS  No siempre se conoce la causa exacta de la cefalea migraosa. Sin embargo, Production assistant, radio [employer] nervios del cerebro se irritan y liberan ciertas sustancias qumicas que causan inflamacin. Esto ocasiona dolor. Existen tambin ciertos factores que pueden desencadenar las migraas, como los siguientes:  Alcohol.  Fumar.  Estrs.  La menstruacin  Quesos aejados.  Los alimentos o las bebidas que contienen nitratos, glutamato, aspartamo o tiramina.  Falta de sueo.  Chocolate.  Cafena.  Hambre.  Actividad fsica extenuante.  Fatiga.  Medicamentos que se usan para tratar Research officer, political party (nitroglicerina), pldoras anticonceptivas, estrgeno y algunos medicamentos para la hipertensin arterial. SIGNOS Y SNTOMAS  Dolor en uno o ambos lados de la cabeza.  Dolor pulsante o punzante.  Dolor intenso que impide [employer] actividades diarias.  Dolor que se agrava por cualquier actividad fsica.  Nuseas, vmitos o ambos.  Mareos.  Dolor con la exposicin a las luces brillantes, a los ruidos fuertes o la [place].  Sensibilidad general a las luces brillantes, a los ruidos fuertes o a los [employer]. Antes de sufrir una migraa, puede recibir seales de advertencia (aura). Un aura puede incluir:  Ver las luces intermitentes.  Ver puntos brillantes, halos o lneas en zigzag.  Tener una visin en tnel o visin borrosa.  Sensacin de entumecimiento u hormigueo.  Dificultad para hablar  Debilidad muscular. DIAGNSTICO  La cefalea migraosa se diagnostica en funcin de lo siguiente:  Sntomas.  Examen fsico.  Ardelia Mems TC (tomografa computada) o resonancia magntica de la cabeza. Estas pruebas de diagnstico por imagen no pueden diagnosticar las migraas, pero pueden  ayudar a Paramedic otras causas de las cefaleas. TRATAMIENTO Le prescribirn medicamentos para Best boy y las nuseas. Tambin podrn administrarse medicamentos para ayudar a Investment banker, corporate.  INSTRUCCIONES PARA EL CUIDADO EN EL HOGAR  Slo tome medicamentos de venta libre o recetados para Glass blower/designer o Health and safety inspector, segn las indicaciones de su mdico. No se recomienda usar los opiceos a Barrister's clerk.  Cuando tenga la migraa, acustese en un cuarto oscuro y tranquilo  Lleve un registro diario para Neurosurgeon lo que puede provocar las Psychologist, occupational. Por ejemplo, escriba:  Lo que usted come y bebe.  Cunto tiempo duerme.  Algn cambio en su dieta o en los medicamentos.  Limite el consumo de bebidas alcohlicas.  Si fuma, deje de hacerlo.  Duerma entre 7 y 99horas, o segn las recomendaciones del mdico.  Limite el estrs.  [place] luces tenues si le [employer] luces brillantes y la [place]. SOLICITE ATENCIN MDICA DE INMEDIATO SI:   La migraa se hace cada vez ms intensa.  Tiene fiebre.  Presenta rigidez en el cuello.  Tiene prdida de visin.  Presenta debilidad muscular o prdida del control muscular.  Comienza a perder el equilibrio o tiene problemas para caminar.  Sufre mareos o se desmaya.  Tiene sntomas graves que son diferentes a los primeros sntomas. ASEGRESE DE QUE:   Comprende estas instrucciones.  Controlar su afeccin.  Recibir ayuda de inmediato si no mejora o si empeora. Document Released: 06/04/2005 Document Revised: 03/25/2013 [facility] Patient Information 2015 [place], [place]. This information is not intended to replace advice given to you by your health care provider. Make sure you discuss any questions you have with your  health care provider.  

## 2014-08-30 NOTE — Progress Notes (Signed)
   Subjective:    Patient ID: Darlene Salazar, female    DOB: 1971-04-09, 44 y.o.   MRN: [id number]  Headache  This is a chronic problem. The current episode started in the past 7 days. The problem occurs daily (first time she has had a headche that affects half of her head. Usual headaches are 4 times per week). The problem has been gradually worsening. The pain is located in the right unilateral and retro-orbital region. The pain radiates to the right neck, right shoulder and upper back. The pain quality is not similar to prior headaches. Quality: pressure. The pain is at a severity of 8/10. Associated symptoms include eye pain, nausea, neck pain, numbness, phonophobia and photophobia. Pertinent negatives include no abdominal pain, blurred vision, dizziness, tingling or vomiting. Nothing aggravates the symptoms. She has tried NSAIDs for the symptoms. The treatment provided mild relief. Her past medical history is significant for migraine headaches and obesity. There is no history of hypertension or recent head traumas.    Review of Systems  Eyes: Positive for photophobia and pain. Negative for blurred vision.  Gastrointestinal: Positive for nausea. Negative for vomiting and abdominal pain.  Musculoskeletal: Positive for neck pain.  Neurological: Positive for numbness and headaches. Negative for dizziness and tingling.       Objective:   Physical Exam  Constitutional: She is oriented to person, place, and time.  Eyes: EOM are normal.  Neck: Normal range of motion. Neck supple.  Cardiovascular: Normal rate, regular rhythm and normal heart sounds.   Pulmonary/Chest: Effort normal and breath sounds normal.  Lymphadenopathy:    She has no cervical adenopathy.  Neurological: She is alert and oriented to person, place, and time. No cranial nerve deficit. Coordination normal.      Assessment & Plan:  Darlene Salazar was seen today for follow-up.  Diagnoses and all orders for this  visit:  Frequent headaches Orders: -     COMPLETE METABOLIC PANEL WITH GFR -    Begin predniSONE (STERAPRED UNI-PAK) 10 MG tablet; Take by mouth daily. 6-5-4-3-2-1-. Take 6 tablets today and decrease by one pill per day until gone -     Begin cyclobenzaprine (FLEXERIL) 10 MG tablet; Take 1 tablet (10 mg total) by mouth 3 (three) times daily as needed. For headaches Will start with round of steroids to see if we can break the cycle of severe headaches. If no improvement in 1 month we will begin topamax for headache prophylaxis.    Morbid obesity Orders: -     Lipid panel -     CBC -     TSH -     Hemoglobin A1c   Return in about 4 weeks (around 09/27/2014) for headaches .   May return to clinic if she does not have any improvement.   Darlene Manning, NP 4:37 PM 08/30/2014

## 2014-09-03 ENCOUNTER — Telehealth: Payer: Self-pay | Admitting: *Deleted

## 2014-09-03 MED ORDER — ATORVASTATIN CALCIUM 10 MG PO TABS: 10.0000 mg | ORAL_TABLET | Freq: Every day | ORAL | Status: DC

## 2014-09-03 NOTE — Telephone Encounter (Signed)
Pt is aware of her lab results.  

## 2014-09-03 NOTE — Telephone Encounter (Signed)
-----   Message from Lance Bosch, NP sent at 08/31/2014 10:38 AM EDT ----- Cholesterol  elevated. Please provide appropriate education regarding diet and exercise.  Please send atorvastatin 10 mg PO daily. Please explain why lower cholesterol is important. Thanks

## 2015-03-08 ENCOUNTER — Ambulatory Visit: Payer: Self-pay | Attending: Internal Medicine | Admitting: Internal Medicine

## 2015-03-08 ENCOUNTER — Encounter: Payer: Self-pay | Admitting: Internal Medicine

## 2015-03-08 VITALS — BP 113/77 | HR 84 | Temp 98.0°F | Resp 16 | Ht <= 58 in | Wt 190.0 lb

## 2015-03-08 DIAGNOSIS — E785 Hyperlipidemia, unspecified: Secondary | ICD-10-CM

## 2015-03-08 DIAGNOSIS — R519 Headache, unspecified: Secondary | ICD-10-CM

## 2015-03-08 DIAGNOSIS — R51 Headache: Secondary | ICD-10-CM | POA: Insufficient documentation

## 2015-03-08 DIAGNOSIS — Z79899 Other long term (current) drug therapy: Secondary | ICD-10-CM | POA: Insufficient documentation

## 2015-03-08 DIAGNOSIS — I10 Essential (primary) hypertension: Secondary | ICD-10-CM | POA: Insufficient documentation

## 2015-03-08 DIAGNOSIS — R1030 Lower abdominal pain, unspecified: Secondary | ICD-10-CM

## 2015-03-08 LAB — POCT URINALYSIS DIPSTICK
Bilirubin, UA: NEGATIVE
Glucose, UA: NEGATIVE
Ketones, UA: NEGATIVE
Leukocytes, UA: NEGATIVE
Nitrite, UA: NEGATIVE
PH UA: 7
PROTEIN UA: NEGATIVE
SPEC GRAV UA: 1.02
UROBILINOGEN UA: 1

## 2015-03-08 LAB — POCT URINE PREGNANCY: PREG TEST UR: NEGATIVE

## 2015-03-08 MED ORDER — ATORVASTATIN CALCIUM 10 MG PO TABS: 10.0000 mg | ORAL_TABLET | Freq: Every day | ORAL | Status: DC

## 2015-03-08 MED ORDER — CYCLOBENZAPRINE HCL 10 MG PO TABS: 10.0000 mg | ORAL_TABLET | Freq: Three times a day (TID) | ORAL | Status: DC | PRN

## 2015-03-08 NOTE — Progress Notes (Signed)
Patient ID: Darlene Salazar, female   DOB: [birth year], 44 y.o.   MRN: [id number]  CC: abdominal pain, headaches  HPI: Darlene Salazar is a 44 y.o. female here today for a follow up visit.  Patient has past medical history of HTN and uterine fibroids. Patient has been evaluated here in the past for lower abdominal pain. Patient was then referred to GYN for management of fibroids. She was started on Micronor for oral birth control to help patient's dysmenorrhea. Patient has since been taking Micronor and reports that it has helped her pain and bleeding some. Patient reports that she feels pain where her ovaries are. The pain is is sharp and burning in nature. She usually has 8-10 days of bleeding per month that is not as heavy.  She continues to report headaches, nausea, and dizziness. Headaches are almost daily in the afternoon. When she has headaches her eyes are blurry and she gets nauseous. Headaches usually last 1 hour and are mild. Headaches are frontal and throbbing. She has tried Excedrin for pain.     No Known Allergies Past Medical History  Diagnosis Date  . Hypertension   . Menorrhagia    Current Outpatient Prescriptions on File Prior to Visit  Medication Sig Dispense Refill  . atorvastatin (LIPITOR) 10 MG tablet Take 1 tablet (10 mg total) by mouth daily. 90 tablet 3  . cyclobenzaprine (FLEXERIL) 10 MG tablet Take 1 tablet (10 mg total) by mouth 3 (three) times daily as needed. For headaches 60 tablet 1  . norethindrone (MICRONOR,CAMILA,ERRIN) 0.35 MG tablet Take 1 tablet (0.35 mg total) by mouth daily. 1 Package 11  . sertraline (ZOLOFT) 50 MG tablet Take 1 tablet (50 mg total) by mouth daily. 30 tablet 6  . predniSONE (STERAPRED UNI-PAK) 10 MG tablet Take by mouth daily. 6-5-4-3-2-1-. Take 6 tablets today and decrease by one pill per day until gone (Patient not taking: Reported on 03/08/2015) 21 tablet 0   No current facility-administered medications on file prior to  visit.   Family History  Problem Relation Age of Onset  . Diabetes Father   . Hyperlipidemia Father   . Hypertension Father   . Breast cancer Mother    Social History   Social History  . Marital Status: Married    Spouse Name: N/A  . Number of Children: N/A  . Years of Education: N/A   Occupational History  . Not on file.   Social History Main Topics  . Smoking status: Never Smoker   . Smokeless tobacco: Never Used  . Alcohol Use: No  . Drug Use: No  . Sexual Activity: Yes    Birth Control/ Protection: None   Other Topics Concern  . Not on file   Social History Narrative   ** Merged History Encounter **        Review of Systems: Other than what is stated in HPI, all other systems are negative.   Objective:   Filed Vitals:   03/08/15 1413  BP: 113/77  Pulse: 84  Temp:   Resp:     Physical Exam  Constitutional: She is oriented to person, place, and time.  Cardiovascular: Normal rate, regular rhythm and normal heart sounds.   Pulmonary/Chest: Effort normal and breath sounds normal.  Abdominal: Soft. Bowel sounds are normal. There is tenderness (pelvic).  Musculoskeletal: She exhibits no edema.  Neurological: She is alert and oriented to person, place, and time. No cranial nerve deficit. Coordination normal.  Skin: Skin is warm and dry.  Psychiatric: She has a normal mood and affect.    Lab Results  Component Value Date   WBC 10.8* 08/30/2014   HGB 11.5* 08/30/2014   HCT 35.7* 08/30/2014   MCV 75.5* 08/30/2014   PLT 434* 08/30/2014   Lab Results  Component Value Date   CREATININE 0.61 08/30/2014   BUN 11 08/30/2014   NA 137 08/30/2014   K 4.7 08/30/2014   CL 103 08/30/2014   CO2 25 08/30/2014    Lab Results  Component Value Date   HGBA1C 6.0* 08/30/2014   Lipid Panel     Component Value Date/Time   CHOL 243* 08/30/2014 1225   TRIG 194* 08/30/2014 1225   HDL 36* 08/30/2014 1225   CHOLHDL 6.8 08/30/2014 1225   VLDL 39 08/30/2014 1225    LDLCALC 168* 08/30/2014 1225       Assessment and plan:   [name] was seen today for abdominal pain.  Diagnoses and all orders for this visit:  Lower abdominal pain -     Urinalysis Dipstick -     POCT urine pregnancy I have referred patient back to GYN for evaluation  Frequent headaches -     Refill cyclobenzaprine (FLEXERIL) 10 MG tablet; Take 1 tablet (10 mg total) by mouth 3 (three) times daily as needed. For headaches Patient will keep a headache diary, if no improvement I will consider placing patient on Topamax for preventative therapy.   HLD (hyperlipidemia) -     Refill atorvastatin (LIPITOR) 10 MG tablet; Take 1 tablet (10 mg total) by mouth daily. Last LDL was 08/2014 and not at goal. Addressed diet changes   Return in about 6 months (around 09/05/2015) for HLD.      Lance Bosch, [facility] [phone] 03/08/2015, 2:20 PM'

## 2015-03-08 NOTE — Patient Instructions (Signed)
Please schedule your appt with GYN

## 2015-03-08 NOTE — Progress Notes (Signed)
Visual interpreter used Darlene Salazar ID [id number] Patient complains of lower abd pain for the past two to three months Patient also complains of dizziness and headaches

## 2015-03-30 ENCOUNTER — Ambulatory Visit: Payer: Self-pay | Attending: Internal Medicine

## 2015-04-28 ENCOUNTER — Ambulatory Visit: Payer: Self-pay | Admitting: Internal Medicine

## 2015-06-29 ENCOUNTER — Other Ambulatory Visit: Payer: Self-pay | Admitting: Internal Medicine

## 2015-06-29 ENCOUNTER — Telehealth: Payer: Self-pay | Admitting: Internal Medicine

## 2015-06-29 ENCOUNTER — Telehealth: Payer: Self-pay

## 2015-06-29 DIAGNOSIS — N946 Dysmenorrhea, unspecified: Secondary | ICD-10-CM

## 2015-06-29 MED ORDER — NORETHINDRONE 0.35 MG PO TABS: 1.0000 | ORAL_TABLET | Freq: Every day | ORAL | Status: DC

## 2015-06-29 NOTE — Telephone Encounter (Signed)
Pt. Came in today requesting a med refill for her birth control pills. Pt. Stated she is out of her birth control.Please f/u with pt.

## 2015-06-29 NOTE — Telephone Encounter (Signed)
Interpreter line used Mustavo ID# [id number] Tried to call patient back Patient not available Message left on voice mail to return our call

## 2015-07-11 NOTE — Telephone Encounter (Signed)
Pt. Returned call. Please f/u °

## 2015-07-11 NOTE — Telephone Encounter (Signed)
Interpreter line used Vicente Males ID# [id number] Returned phone call to patient  Patient not available Message left on voice mail to return our call Patient has eleven refills on her birth control pills Patient needs to call her pharmacy and request a refill

## 2015-07-13 MED FILL — NORETHINDRONE 0.35 MG TAB: 0.35 | 56 days supply | Qty: 56 | Fill #0

## 2015-07-27 ENCOUNTER — Ambulatory Visit: Payer: Self-pay | Admitting: Internal Medicine

## 2015-07-28 ENCOUNTER — Encounter: Payer: Self-pay | Admitting: Gynecology

## 2015-07-28 ENCOUNTER — Ambulatory Visit (INDEPENDENT_AMBULATORY_CARE_PROVIDER_SITE_OTHER): Payer: Self-pay | Admitting: Gynecology

## 2015-07-28 VITALS — BP 120/82 | Ht <= 58 in | Wt 184.6 lb

## 2015-07-28 DIAGNOSIS — D252 Subserosal leiomyoma of uterus: Secondary | ICD-10-CM

## 2015-07-28 DIAGNOSIS — N920 Excessive and frequent menstruation with regular cycle: Secondary | ICD-10-CM

## 2015-07-28 DIAGNOSIS — D250 Submucous leiomyoma of uterus: Secondary | ICD-10-CM | POA: Insufficient documentation

## 2015-07-28 DIAGNOSIS — N946 Dysmenorrhea, unspecified: Secondary | ICD-10-CM

## 2015-07-28 NOTE — Patient Instructions (Signed)
Biopsia de endometrio  (Endometrial Biopsy)  La biopsia de endometrio es un procedimiento en el que se toma una muestra de tejido del útero. Luego la muestra de tejido se observa en el microscopio para ver si el tejido es normal o anormal. El endometrio es el revestimiento interno del útero. Este procedimiento ayuda a determinar si está en el ciclo menstrual y de que modo los niveles de hormonas afectan el revestimiento del útero. Este procedimiento también se usa para evaluar el sangrado uterino o para diagnosticar el cáncer de endometrio, tuberculosis, pólipos o enfermedades inflamatorias.   INFORME A SU MÉDICO:  · Cualquier alergia que tenga.  · Todos los medicamentos que utiliza, incluyendo vitaminas, hierbas, gotas oftálmicas, cremas y medicamentos de venta libre.  · Problemas previos que usted o los miembros de su familia hayan tenido con el uso de anestésicos.  · Enfermedades de la sangre.  · Cirugías previas.  · Padecimientos médicos.  · Posible embarazo.  RIESGOS Y COMPLICACIONES  Generalmente es un procedimiento seguro. Sin embargo, como en cualquier procedimiento, pueden surgir complicaciones. Las complicaciones posibles son:  · Hemorragias.  · Infecciones pélvicas  · Lesión en la pared del útero con el instrumento utilizado para tomar la biopsia (raro).  ANTES DEL PROCEDIMIENTO   · Lleve un registro de sus ciclos menstruales según las indicaciones de su médico. Puede ser necesario que programe el procedimiento para un momento específico del ciclo menstrual.  · Tendrá que llevar un apósito sanitario para usar después del procedimiento.  · Pídale a alguna persona que la lleve a su casa después del procedimiento si le dan un medicamento para relajarse (sedante).  PROCEDIMIENTO   · Le podrán administrar un medicamento para relajarse.  · Deberá recostarse en una camilla con los pies y las piernas elevados, como en el examen pélvico.  · El médico insertará un instrumento (espéculo) en la vagina para observar  el cuello del útero.  · El cuello del útero será desinfectado con una solución antiséptica. Para adormecer el cuello del útero le aplicarán un medicamento (anestésico local).  · Se utilizará un fórceps (tenáculo) para mantener el cuello firme.  · Se insertará un instrumento delgado, similar a una varilla (sonda uterina) a través del cuello del útero para determinar su longitud y la ubicación en la que será tomada la muestra para la biopsia.  · Luego se pasa un tubo delgado y flexible (catéter) a través del cuello del útero hasta el útero. El catéter se utiliza para recolectar la muestra de tejido del endometrio para la biopsia.  · El catéter y el espéculo se retirarán y la muestra se enviará al laboratorio para ser examinada.  DESPUÉS DEL PROCEDIMIENTO  · Descansará en una sala de recuperación hasta que esté lista para volver a su casa.  · Sentirá cólicos leves y tendrá una pequeña cantidad de sangrado vaginal durante algunos días después del procedimiento. Esto es normal.  · Asegúrese de obtener los resultados.     Esta información no tiene como fin reemplazar el consejo del médico. Asegúrese de hacerle al médico cualquier pregunta que tenga.     Document Released: 02/04/2013 Document Revised: 06/09/2013  Elsevier Interactive Patient Education ©2016 [employer]vier Inc[employer]

## 2015-07-28 NOTE — Progress Notes (Signed)
   Patient is a 45 year old gravida 5 para 3 (normal spontaneous vaginal deliveries 2 miscarriages) who presented to the office today referred by her primary care physician at the urgent care who instructed patient to discontinue her oral contraceptive pill (Micronor) because her fibroid uterus had continued to grow in size. The most recent ultrasound that I have was in 2014 whereby the following was noted:  Uterus measured 10.6 x 6.6 x 7.1 cm with a posterior subserosal fibroid measuring 2.8 x 2.4 x 2.4 cm normal-appearing ovaries otherwise and a thickened endometrium.  Patient reports that she has cycles are very heavy lasting 7 and sometimes up to 10 days very heavy and the Micronor has helped decrease in amount of bleeding as well as a cramping that she had experienced. Patient has had Pap sm[facility] in 2012 and [facility] which were reportedly normal. Her last mammogram was in 2014. Patient has not been using any form of contraception and and birth control pill one week ago.  Exam: Overweight Hispanic patient no acute distress Abdomen: Soft nontender no rebound or guarding Pelvic: Bartholin urethra Skene was within normal limits Vagina: No lesions or discharge Cervix: No lesions or discharge Uterus: 8-10 week size mobile nontender Adnexa: No masses or tenderness Rectal exam: Not done  Patient was counseled for an endometrial biopsy. The cervix was cleansed with Betadine solution and a sterile Pipelle was introduced into the uterine cavity. Uterus sounded to 9 have centimeters and copious amount of tissue was obtained which was submitted for histological evaluation.  Assessment/plan: 45 year old patient with history of fibroid uterus and dysmenorrhea and menorrhagia will return back next week for follow-up ultrasound to compare with previous study in 2014 and determine if patient may return back to oral contraceptive pill to help with the symptoms. Will notify her sutures  endometrial biopsy results become available. Adnexa office visit will provide her with the application to apply for a scholarship program for helped patients who cannot afford mammograms. All the above instructions were provided in Spanish.

## 2015-07-28 NOTE — Addendum Note (Signed)
Addended by: Alen Blew on: 07/28/2015 02:00 PM   Modules accepted: Orders

## 2015-08-04 ENCOUNTER — Ambulatory Visit: Payer: Self-pay | Admitting: Gynecology

## 2015-08-04 ENCOUNTER — Other Ambulatory Visit: Payer: Self-pay

## 2015-08-17 ENCOUNTER — Ambulatory Visit: Payer: Self-pay | Admitting: Gynecology

## 2015-08-17 ENCOUNTER — Other Ambulatory Visit: Payer: Self-pay

## 2015-08-21 ENCOUNTER — Encounter (HOSPITAL_COMMUNITY): Payer: Self-pay

## 2015-08-21 ENCOUNTER — Emergency Department (HOSPITAL_COMMUNITY): Payer: Self-pay

## 2015-08-21 ENCOUNTER — Emergency Department (HOSPITAL_COMMUNITY)
Admission: EM | Admit: 2015-08-21 | Discharge: 2015-08-22 | Disposition: A | Discharge status: Home / Self Care | Payer: Self-pay | Attending: Emergency Medicine | Admitting: Emergency Medicine

## 2015-08-21 DIAGNOSIS — I10 Essential (primary) hypertension: Secondary | ICD-10-CM | POA: Insufficient documentation

## 2015-08-21 DIAGNOSIS — Z8639 Personal history of other endocrine, nutritional and metabolic disease: Secondary | ICD-10-CM | POA: Insufficient documentation

## 2015-08-21 DIAGNOSIS — R1032 Left lower quadrant pain: Secondary | ICD-10-CM

## 2015-08-21 DIAGNOSIS — D250 Submucous leiomyoma of uterus: Secondary | ICD-10-CM | POA: Insufficient documentation

## 2015-08-21 DIAGNOSIS — Z3202 Encounter for pregnancy test, result negative: Secondary | ICD-10-CM | POA: Insufficient documentation

## 2015-08-21 DIAGNOSIS — R079 Chest pain, unspecified: Secondary | ICD-10-CM | POA: Insufficient documentation

## 2015-08-21 LAB — COMPREHENSIVE METABOLIC PANEL
ALT: 21 U/L (ref 14–54)
ANION GAP: 8 (ref 5–15)
AST: 21 U/L (ref 15–41)
Albumin: 3.5 g/dL (ref 3.5–5.0)
Alkaline Phosphatase: 78 U/L (ref 38–126)
BILIRUBIN TOTAL: 0.2 mg/dL — AB (ref 0.3–1.2)
BUN: 11 mg/dL (ref 6–20)
CO2: 25 mmol/L (ref 22–32)
Calcium: 8.6 mg/dL — ABNORMAL LOW (ref 8.9–10.3)
Chloride: 102 mmol/L (ref 101–111)
Creatinine, Ser: 0.72 mg/dL (ref 0.44–1.00)
GFR calc Af Amer: >60 mL/min (ref >60)
GLUCOSE: 103 mg/dL — AB (ref 65–99)
POTASSIUM: 3.9 mmol/L (ref 3.5–5.1)
Sodium: 135 mmol/L (ref 135–145)
Total Protein: 6.9 g/dL (ref 6.5–8.1)

## 2015-08-21 LAB — CBC
HEMATOCRIT: 33.3 % — AB (ref 36.0–46.0)
HEMOGLOBIN: 10.2 g/dL — AB (ref 12.0–15.0)
MCH: 21.7 pg — AB (ref 26.0–34.0)
MCHC: 30.6 g/dL (ref 30.0–36.0)
MCV: 70.7 fL — ABNORMAL LOW (ref 78.0–100.0)
Platelets: 440 10*3/uL — ABNORMAL HIGH (ref 150–400)
RBC: 4.71 MIL/uL (ref 3.87–5.11)
RDW: 17.5 % — ABNORMAL HIGH (ref 11.5–15.5)
WBC: 6.7 10*3/uL (ref 4.0–10.5)

## 2015-08-21 LAB — URINALYSIS, ROUTINE W REFLEX MICROSCOPIC
BILIRUBIN URINE: NEGATIVE
Glucose, UA: NEGATIVE mg/dL
Ketones, ur: NEGATIVE mg/dL
Leukocytes, UA: NEGATIVE
NITRITE: NEGATIVE
PH: 7 (ref 5.0–8.0)
Protein, ur: NEGATIVE mg/dL
SPECIFIC GRAVITY, URINE: 1.011 (ref 1.005–1.030)

## 2015-08-21 LAB — URINE MICROSCOPIC-ADD ON
BACTERIA UA: NONE SEEN
WBC UA: NONE SEEN WBC/hpf (ref 0–5)

## 2015-08-21 LAB — LIPASE, BLOOD: Lipase: 27 U/L (ref 11–51)

## 2015-08-21 LAB — I-STAT BETA HCG BLOOD, ED (MC, WL, AP ONLY): I-stat hCG, quantitative: <5 m[IU]/mL (ref <5)

## 2015-08-21 LAB — I-STAT TROPONIN, ED: Troponin i, poc: 0 ng/mL (ref 0.00–0.08)

## 2015-08-21 MED ORDER — PROMETHAZINE HCL 25 MG PO TABS: 25.0000 mg | ORAL_TABLET | Freq: Four times a day (QID) | ORAL | Status: DC | PRN

## 2015-08-21 MED ORDER — ONDANSETRON HCL 4 MG/2ML IJ SOLN
4.0000 mg | Freq: Once | INTRAMUSCULAR | Status: AC
  Administered 2015-08-21: 4 mg via INTRAVENOUS
  Filled 2015-08-21: qty 2

## 2015-08-21 MED ORDER — HYDROMORPHONE HCL 1 MG/ML IJ SOLN
1.0000 mg | Freq: Once | INTRAMUSCULAR | Status: DC
  Filled 2015-08-21: qty 1

## 2015-08-21 MED ORDER — HYDROCODONE-ACETAMINOPHEN 5-325 MG PO TABS: 1.0000 | ORAL_TABLET | Freq: Four times a day (QID) | ORAL | Status: DC | PRN

## 2015-08-21 MED ORDER — ONDANSETRON 4 MG PO TBDP: 4.0000 mg | ORAL_TABLET | Freq: Three times a day (TID) | ORAL | Status: DC | PRN

## 2015-08-21 MED ORDER — FENTANYL CITRATE (PF) 100 MCG/2ML IJ SOLN
50.0000 ug | Freq: Once | INTRAMUSCULAR | Status: AC
  Administered 2015-08-21: 50 ug via INTRAVENOUS
  Filled 2015-08-21: qty 2

## 2015-08-21 MED ORDER — IOHEXOL 300 MG/ML  SOLN
100.0000 mL | Freq: Once | INTRAMUSCULAR | Status: AC | PRN
  Administered 2015-08-21: 100 mL via INTRAVENOUS

## 2015-08-21 MED ORDER — SODIUM CHLORIDE 0.9 % IV SOLN
INTRAVENOUS | Status: DC
  Administered 2015-08-21: 21:00:00 via INTRAVENOUS

## 2015-08-21 NOTE — Discharge Instructions (Signed)
Workup for the abdominal pain without any significant findings. Also evaluation for the chest discomfort was negative. Make an appointment to follow-up with your GYN doctor. Pain may be due to the uterine fibroid. Take pain medicine as directed. Take the antinausea medicine as directed.

## 2015-08-21 NOTE — ED Notes (Signed)
Pt was seen at [facility] last week for abd pain to assess uterine fibroid. Pain on LLQ has been worse today.  No other s/s noted.

## 2015-08-21 NOTE — ED Notes (Signed)
Husband asking for pain med for pt.  Talked to triage

## 2015-08-21 NOTE — ED Provider Notes (Signed)
CSN: [id number]     Arrival date & time 08/21/15  1517 History   First MD Initiated Contact with Patient 08/21/15 1800     Chief Complaint  Patient presents with  . Abdominal Pain     (Consider location/radiation/quality/duration/timing/severity/associated sxs/prior Treatment) Patient is a 45 y.o. female presenting with abdominal pain. The history is provided by the patient, the spouse and a relative.  Abdominal Pain Associated symptoms: chest pain, nausea and vomiting   Associated symptoms: no dysuria, no fever and no shortness of breath    patient with a history of lower abdominal pain now for several months but worse in the last 24 hours. Also radiated into the chest. Patient being followed by GYN for menstrual problems as of poor knowledge of a uterine fibroid. Patient states that the left lower quadrant abdominal pain is 10 out of 10. Associated with some nausea no vomiting no vomiting of any blood no diarrhea. No fevers. Does radiate up into the chest area. No shortness of breath.  Past Medical History  Diagnosis Date  . Hypertension   . Menorrhagia   . High cholesterol    Past Surgical History  Procedure Laterality Date  . Cholecystectomy    . Wisdom tooth extraction     Family History  Problem Relation Age of Onset  . Diabetes Father   . Hyperlipidemia Father   . Hypertension Father   . Cancer Mother 31    uterine  . Diabetes Brother    Social History  Substance Use Topics  . Smoking status: Never Smoker   . Smokeless tobacco: Never Used  . Alcohol Use: No   OB History    Gravida Para Term Preterm AB TAB SAB Ectopic Multiple Living   5 3 3  0 2 0 2 0 0 3     Review of Systems  Constitutional: Negative for fever.  HENT: Negative for congestion.   Eyes: Negative for visual disturbance.  Respiratory: Negative for shortness of breath.   Cardiovascular: Positive for chest pain.  Gastrointestinal: Positive for nausea, vomiting and abdominal pain.  Genitourinary:  Negative for dysuria.  Musculoskeletal: Negative for back pain.  Skin: Negative for rash.  Neurological: Negative for headaches.  Hematological: Does not bruise/bleed easily.  Psychiatric/Behavioral: Negative for confusion.      Allergies  Review of patient's allergies indicates no known allergies.  Home Medications   Prior to Admission medications   Medication Sig Start Date End Date Taking? Authorizing Provider  ibuprofen (ADVIL,MOTRIN) 200 MG tablet Take 400 mg by mouth every 6 (six) hours as needed for headache or moderate pain.   Yes Historical Provider, MD  atorvastatin (LIPITOR) 10 MG tablet Take 1 tablet (10 mg total) by mouth daily. Patient not taking: Reported on 07/28/2015 03/08/15   Lance Bosch, NP  HYDROcodone-acetaminophen (NORCO/VICODIN) 5-325 MG tablet Take 1-2 tablets by mouth every 6 (six) hours as needed for moderate pain. 08/21/15   Fredia Sorrow, MD  norethindrone (MICRONOR,CAMILA,ERRIN) 0.35 MG tablet Take 1 tablet (0.35 mg total) by mouth daily. Patient not taking: Reported on 07/28/2015 06/29/15   Lance Bosch, NP  ondansetron (ZOFRAN ODT) 4 MG disintegrating tablet Take 1 tablet (4 mg total) by mouth every 8 (eight) hours as needed for nausea or vomiting. 08/21/15   Fredia Sorrow, MD  promethazine (PHENERGAN) 25 MG tablet Take 1 tablet (25 mg total) by mouth every 6 (six) hours as needed for nausea or vomiting. 08/21/15   Fredia Sorrow, MD   BP [phone]  mmHg  Pulse 79  Temp(Src) 98.8 F (37.1 C) (Oral)  Resp 16  SpO2 98%  LMP 08/08/2015 Physical Exam  Constitutional: She is oriented to person, place, and time. She appears well-developed and well-nourished. No distress.  HENT:  Head: Normocephalic and atraumatic.  Mouth/Throat: Oropharynx is clear and moist.  Eyes: Conjunctivae and EOM are normal. Pupils are equal, round, and reactive to light.  Neck: Normal range of motion. Neck supple.  Cardiovascular: Normal rate, regular rhythm and normal heart sounds.    Pulmonary/Chest: Effort normal and breath sounds normal. No respiratory distress.  Abdominal: Soft. Bowel sounds are normal. There is no tenderness.  Musculoskeletal: Normal range of motion.  Neurological: She is alert and oriented to person, place, and time. No cranial nerve deficit. She exhibits normal muscle tone. Coordination normal.  Skin: Skin is warm. No rash noted.  Nursing note and vitals reviewed.   ED Course  Procedures (including critical care time) Labs Review Labs Reviewed  COMPREHENSIVE METABOLIC PANEL - Abnormal; Notable for the following:    Glucose, Bld 103 (*)    Calcium 8.6 (*)    Total Bilirubin 0.2 (*)    All other components within normal limits  CBC - Abnormal; Notable for the following:    Hemoglobin 10.2 (*)    HCT 33.3 (*)    MCV 70.7 (*)    MCH 21.7 (*)    RDW 17.5 (*)    Platelets 440 (*)    All other components within normal limits  URINALYSIS, ROUTINE W REFLEX MICROSCOPIC (NOT AT [facility]) - Abnormal; Notable for the following:    Hgb urine dipstick TRACE (*)    All other components within normal limits  URINE MICROSCOPIC-ADD ON - Abnormal; Notable for the following:    Squamous Epithelial / LPF 0-5 (*)    All other components within normal limits  LIPASE, BLOOD  I-STAT BETA HCG BLOOD, ED ([facility], [facility], [facility] ONLY)  I-STAT TROPOININ, ED   Results for orders placed or performed during the hospital encounter of 08/21/15  Lipase, blood  Result Value Ref Range   Lipase 27 11 - 51 U/L  Comprehensive metabolic panel  Result Value Ref Range   Sodium 135 135 - 145 mmol/L   Potassium 3.9 3.5 - 5.1 mmol/L   Chloride 102 101 - 111 mmol/L   CO2 25 22 - 32 mmol/L   Glucose, Bld 103 (H) 65 - 99 mg/dL   BUN 11 6 - 20 mg/dL   Creatinine, Ser 0.72 0.44 - 1.00 mg/dL   Calcium 8.6 (L) 8.9 - 10.3 mg/dL   Total Protein 6.9 6.5 - 8.1 g/dL   Albumin 3.5 3.5 - 5.0 g/dL   AST 21 15 - 41 U/L   ALT 21 14 - 54 U/L   Alkaline Phosphatase 78 38 - 126 U/L   Total Bilirubin  0.2 (L) 0.3 - 1.2 mg/dL   GFR calc non Af Amer >60 >60 mL/min   GFR calc Af Amer >60 >60 mL/min   Anion gap 8 5 - 15  CBC  Result Value Ref Range   WBC 6.7 4.0 - 10.5 K/uL   RBC 4.71 3.87 - 5.11 MIL/uL   Hemoglobin 10.2 (L) 12.0 - 15.0 g/dL   HCT 33.3 (L) 36.0 - 46.0 %   MCV 70.7 (L) 78.0 - 100.0 fL   MCH 21.7 (L) 26.0 - 34.0 pg   MCHC 30.6 30.0 - 36.0 g/dL   RDW 17.5 (H) 11.5 - 15.5 %  Platelets 440 (H) 150 - 400 K/uL  Urinalysis, Routine w reflex microscopic (not at [facility])  Result Value Ref Range   Color, Urine YELLOW YELLOW   APPearance CLEAR CLEAR   Specific Gravity, Urine 1.011 1.005 - 1.030   pH 7.0 5.0 - 8.0   Glucose, UA NEGATIVE NEGATIVE mg/dL   Hgb urine dipstick TRACE (A) NEGATIVE   Bilirubin Urine NEGATIVE NEGATIVE   Ketones, ur NEGATIVE NEGATIVE mg/dL   Protein, ur NEGATIVE NEGATIVE mg/dL   Nitrite NEGATIVE NEGATIVE   Leukocytes, UA NEGATIVE NEGATIVE  Urine microscopic-add on  Result Value Ref Range   Squamous Epithelial / LPF 0-5 (A) NONE SEEN   WBC, UA NONE SEEN 0 - 5 WBC/hpf   RBC / HPF 0-5 0 - 5 RBC/hpf   Bacteria, UA NONE SEEN NONE SEEN  I-Stat beta hCG blood, ED ([facility], [facility], [facility] only)  Result Value Ref Range   I-stat hCG, quantitative <5.0 <5 mIU/mL   Comment 3          I-Stat Troponin, ED (not at [facility])  Result Value Ref Range   Troponin i, poc 0.00 0.00 - 0.08 ng/mL   Comment 3             Imaging Review Dg Chest 2 View  08/21/2015  CLINICAL DATA:  Left upper quadrant abdominal pain, chest pain EXAM: CHEST  2 VIEW COMPARISON:  07/24/2011 FINDINGS: Lungs are clear.  No pleural effusion or pneumothorax. The heart is normal in size. Mild degenerative changes of the visualized thoracolumbar spine. Cholecystectomy clips. IMPRESSION: No evidence of acute cardiopulmonary disease. Electronically Signed   By: Julian Hy M.D.   On: 08/21/2015 19:45   Ct Abdomen Pelvis W Contrast  08/21/2015  CLINICAL DATA:  Left lower quadrant abdominal pain for months,  increased in severity today. EXAM: CT ABDOMEN AND PELVIS WITH CONTRAST TECHNIQUE: Multidetector CT imaging of the abdomen and pelvis was performed using the standard protocol following bolus administration of intravenous contrast. CONTRAST:  153mL OMNIPAQUE IOHEXOL 300 MG/ML  SOLN COMPARISON:  CT 08/26/2009, pelvic ultrasound 06/03/2013 FINDINGS: Lower chest: The included lung bases are clear. Heart is normal in size. Liver: No focal lesion.  Probable mild hepatic steatosis. Hepatobiliary: Clips in the gallbladder fossa postcholecystectomy. No biliary dilatation. Pancreas: No ductal dilatation or inflammation. Spleen: Normal.  Cleft posteriorly. Adrenal glands: No nodule. Kidneys: Symmetric renal enhancement and excretion. No hydronephrosis. No perinephric stranding. Stomach/Bowel: Stomach physiologically distended. There are no dilated or thickened small bowel loops. Small to moderate stool burden without colonic wall thickening. Mild diverticulosis of the descending colon without diverticulitis. The appendix is normal. Vascular/Lymphatic: No retroperitoneal adenopathy. Abdominal aorta is normal in caliber. Reproductive: Uterus appears prominent in size. Rounded fibroid at the fundus measures at least 4.3 cm, previously 2.8 cm ultrasound 2 years prior. Prominent left adnexal vascularity and dilatation of the left ovarian vein. Peripherally enhancing 2.5 cm cyst in the left ovary may be a corpus luteum. Right ovary not definitively identified. Bladder: Physiologically distended, no wall thickening. Other: No free air, free fluid, or intra-abdominal fluid collection. Musculoskeletal: There are no acute or suspicious osseous abnormalities. Bony fusion of T12-L1, L2-L3, and L4-L5, unchanged from prior exam. Mild anterolisthesis of L5 on S1. IMPRESSION: 1. Uterine fibroid measuring 4.3 cm, previously 2.8 cm December 2014 ultrasound. Probable corpus luteal cyst in the left ovary. Prominent left adnexal vascularity, which  can be seen with pelvic congestion syndrome. 2. Mild descending colonic diverticulosis without diverticulitis. Electronically Signed  By: Jeb Levering M.D.   On: 08/21/2015 22:48   I have personally reviewed and evaluated these images and lab results as part of my medical decision-making.   EKG Interpretation None       ED ECG REPORT   Date: 08/21/2015  Rate: 82  Rhythm: normal sinus rhythm  QRS Axis: normal  Intervals: normal  ST/T Wave abnormalities: normal  Conduction Disutrbances:none  Narrative Interpretation:   Old EKG Reviewed: none available  I have personally reviewed the EKG tracing and agree with the computerized printout as noted.     MDM   Final diagnoses:  Left lower quadrant pain  Submucous leiomyoma of uterus   Workup for the abdominal pain without any acute findings to explain the left lower quadrant pain other than the uterine fibroid. In addition patient is concerned with some pain radiating into the chest.   Patients chest x-ray was negative troponin was negative. Patient's had both complaints for a period of time. With the troponin being negative and the duration of the chest discomfort and the abdominal pain with active ruled out an acute cardiac event. Patient's left lower quadrant abdominal pain was worse today. Associated with some nausea and some vomiting.     Fredia Sorrow, MD 08/21/15 2325

## 2015-08-21 NOTE — ED Notes (Signed)
Patient transported to X-ray 

## 2015-08-22 NOTE — ED Notes (Signed)
Used telephone interpertor services to explain discharge instructions, family stated that they had no questions at this time.

## 2015-08-25 ENCOUNTER — Ambulatory Visit: Payer: Self-pay | Attending: Internal Medicine | Admitting: Internal Medicine

## 2015-08-25 ENCOUNTER — Encounter: Payer: Self-pay | Admitting: Internal Medicine

## 2015-08-25 VITALS — BP 123/85 | HR 86 | Temp 98.0°F | Resp 16 | Ht <= 58 in | Wt 187.0 lb

## 2015-08-25 DIAGNOSIS — D649 Anemia, unspecified: Secondary | ICD-10-CM | POA: Insufficient documentation

## 2015-08-25 DIAGNOSIS — Z8049 Family history of malignant neoplasm of other genital organs: Secondary | ICD-10-CM | POA: Insufficient documentation

## 2015-08-25 DIAGNOSIS — Z79899 Other long term (current) drug therapy: Secondary | ICD-10-CM | POA: Insufficient documentation

## 2015-08-25 DIAGNOSIS — R1032 Left lower quadrant pain: Secondary | ICD-10-CM | POA: Insufficient documentation

## 2015-08-25 DIAGNOSIS — Z1272 Encounter for screening for malignant neoplasm of vagina: Secondary | ICD-10-CM | POA: Insufficient documentation

## 2015-08-25 DIAGNOSIS — Z0000 Encounter for general adult medical examination without abnormal findings: Secondary | ICD-10-CM

## 2015-08-25 DIAGNOSIS — I10 Essential (primary) hypertension: Secondary | ICD-10-CM | POA: Insufficient documentation

## 2015-08-25 DIAGNOSIS — D250 Submucous leiomyoma of uterus: Secondary | ICD-10-CM | POA: Insufficient documentation

## 2015-08-25 DIAGNOSIS — E7800 Pure hypercholesterolemia, unspecified: Secondary | ICD-10-CM | POA: Insufficient documentation

## 2015-08-25 MED ORDER — FERROUS SULFATE 325 (65 FE) MG PO TABS: 325.0000 mg | ORAL_TABLET | Freq: Every day | ORAL | Status: DC

## 2015-08-25 MED FILL — FERROUS SULFATE 325 MG TAB: 325 (65 FE) | 30 days supply | Qty: 30 | Fill #0

## 2015-08-25 NOTE — Progress Notes (Signed)
Patient ID: Darlene Salazar, female   DOB: 08-02-1970, 45 y.o.   MRN: [id number]  CC: Annual physical  HPI: Darlene Salazar is a 45 y.o. female here today for a follow up visit.  Patient has past medical history of anxiety, morbid obesity, dysmenorrhea, and submucous leiomyoma of uterus.  Family history of father with diabetes and mother with cervical cancer.  Patient reports pain in LLQ due to uterine fibroids that is affecting her daily life.  Patient having issues with sleeping due to pain.  Patient states she is taking hydrocodone-acetaminophen and ibuprofen for pain.  Patient has been seen by an OBGYN, but states they are out of the office for the next 6 weeks.  Patient requesting to be seen soon as possible and by someone who will accept her orange card. She denies tobacco, drug, and alcohol use. She has no other complaints today.   No Known Allergies Past Medical History  Diagnosis Date  . Hypertension   . Menorrhagia   . High cholesterol    Current Outpatient Prescriptions on File Prior to Visit  Medication Sig Dispense Refill  . atorvastatin (LIPITOR) 10 MG tablet Take 1 tablet (10 mg total) by mouth daily. (Patient not taking: Reported on 07/28/2015) 90 tablet 3  . HYDROcodone-acetaminophen (NORCO/VICODIN) 5-325 MG tablet Take 1-2 tablets by mouth every 6 (six) hours as needed for moderate pain. 10 tablet 0  . ibuprofen (ADVIL,MOTRIN) 200 MG tablet Take 400 mg by mouth every 6 (six) hours as needed for headache or moderate pain.    Marland Kitchen norethindrone (MICRONOR,CAMILA,ERRIN) 0.35 MG tablet Take 1 tablet (0.35 mg total) by mouth daily. (Patient not taking: Reported on 07/28/2015) 1 Package 11  . ondansetron (ZOFRAN ODT) 4 MG disintegrating tablet Take 1 tablet (4 mg total) by mouth every 8 (eight) hours as needed for nausea or vomiting. 10 tablet 1  . promethazine (PHENERGAN) 25 MG tablet Take 1 tablet (25 mg total) by mouth every 6 (six) hours as needed for nausea or vomiting. 12  tablet 1   No current facility-administered medications on file prior to visit.   Family History  Problem Relation Age of Onset  . Diabetes Father   . Hyperlipidemia Father   . Hypertension Father   . Cancer Mother 107    uterine  . Diabetes Brother    Social History   Social History  . Marital Status: Married    Spouse Name: N/A  . Number of Children: N/A  . Years of Education: N/A   Occupational History  . Not on file.   Social History Main Topics  . Smoking status: Never Smoker   . Smokeless tobacco: Never Used  . Alcohol Use: No  . Drug Use: No  . Sexual Activity: Yes    Birth Control/ Protection: None   Other Topics Concern  . Not on file   Social History Narrative   ** Merged History Encounter **        Review of Systems: Constitutional: Negative for fever, chills, diaphoresis, activity change, appetite change and fatigue. HENT: Negative for ear pain, nosebleeds, congestion, facial swelling, rhinorrhea, neck pain, neck stiffness and ear discharge.  Eyes: Negative for pain, discharge, redness, itching and visual disturbance. Respiratory: Negative for cough, choking, chest tightness, shortness of breath, wheezing and stridor.  Cardiovascular: Negative for chest pain, palpitations and leg swelling. Gastrointestinal: LLQ pain, Negative for abdominal distention. Genitourinary: Negative for dysuria, urgency, frequency, hematuria, flank pain, decreased urine volume, difficulty urinating and dyspareunia.  Musculoskeletal: Negative  for back pain, joint swelling, arthralgias and gait problem. Neurological: Negative for dizziness, tremors, seizures, syncope, facial asymmetry, speech difficulty, weakness, light-headedness, numbness and headaches.  Hematological: Negative for adenopathy. Does not bruise/bleed easily. Psychiatric/Behavioral: Negative for hallucinations, behavioral problems, confusion, dysphoric mood, decreased concentration and agitation.  Reproductive:   Heavy menses   Objective:   Filed Vitals:   08/25/15 0952  BP: 123/85  Pulse: 86  Temp: 98 F (36.7 C)  Resp: 16    Physical Exam: Constitutional: Patient appears well-developed and well-nourished. No distress. HENT: Normocephalic, atraumatic, External right and left ear normal. Oropharynx is clear and moist.  Eyes: Conjunctivae and EOM are normal. PERRLA, no scleral icterus. Neck: Normal ROM. Neck supple. No JVD. No tracheal deviation. No thyromegaly. CVS: RRR, S1/S2 +, no murmurs, no gallops, no carotid bruit.  Pulmonary: Effort and breath sounds normal, no stridor, rhonchi, wheezes, rales.  Abdominal: Soft. BS +,  no distension, rebound or guarding. Tenderness Musculoskeletal: Normal range of motion. No edema and no tenderness.  Lymphadenopathy: No lymphadenopathy noted, cervical, inguinal or axillary Neuro: Alert. Normal reflexes, muscle tone coordination. No cranial nerve deficit. Skin: Skin is warm and dry. No rash noted. Not diaphoretic. No erythema. No pallor. Psychiatric: Normal mood and affect. Behavior, judgment, thought content normal. Genitalia: Normal female without lesion, discharge or tenderness, NSSA, NT, no adnexal masses felt on exam  Breast: No tenderness, masses, or nipple abnormality     Lab Results  Component Value Date   WBC 6.7 08/21/2015   HGB 10.2* 08/21/2015   HCT 33.3* 08/21/2015   MCV 70.7* 08/21/2015   PLT 440* 08/21/2015   Lab Results  Component Value Date   CREATININE 0.72 08/21/2015   BUN 11 08/21/2015   NA 135 08/21/2015   K 3.9 08/21/2015   CL 102 08/21/2015   CO2 25 08/21/2015    Lab Results  Component Value Date   HGBA1C 6.0* 08/30/2014   Lipid Panel     Component Value Date/Time   CHOL 243* 08/30/2014 1225   TRIG 194* 08/30/2014 1225   HDL 36* 08/30/2014 1225   CHOLHDL 6.8 08/30/2014 1225   VLDL 39 08/30/2014 1225   LDLCALC 168* 08/30/2014 1225       Assessment and plan:   [name] was seen today for annual  exam.  Diagnoses and all orders for this visit:  Annual physical exam -     Cytology - PAP [place] Will call patient with results.  Anemia, unspecified anemia type -     ferrous sulfate 325 (65 FE) MG tablet; Take 1 tablet (325 mg total) by mouth daily with breakfast. Start due to heavy menses.  Patient to follow up with her OBGYN. She was told by her GYN not to take Micronor pills until seen again.  Submucous leiomyoma of uterus -     Ambulatory referral to Gynecology Will place referral as requested.  Due to language barrier, an interpreter was present during the history-taking and subsequent discussion (and for part of the physical exam) with this patient.  Return if symptoms worsen or fail to improve.      Melissa Noon, BSN, RN, NP student [facility] [phone] 08/25/2015, 11:05 AM

## 2015-08-25 NOTE — Progress Notes (Signed)
Patient here for her annual physical and for her pap

## 2015-08-26 LAB — CERVICOVAGINAL ANCILLARY ONLY
CHLAMYDIA, DNA PROBE: NEGATIVE
NEISSERIA GONORRHEA: NEGATIVE
Wet Prep (BD Affirm): POSITIVE — AB

## 2015-08-29 LAB — CYTOLOGY - PAP

## 2015-08-30 ENCOUNTER — Encounter: Payer: Self-pay | Admitting: Obstetrics & Gynecology

## 2015-08-30 ENCOUNTER — Telehealth: Payer: Self-pay | Admitting: Internal Medicine

## 2015-08-30 ENCOUNTER — Telehealth: Payer: Self-pay

## 2015-08-30 MED ORDER — METRONIDAZOLE 500 MG PO TABS: 500.0000 mg | ORAL_TABLET | Freq: Two times a day (BID) | ORAL | Status: DC

## 2015-08-30 MED FILL — metroNIDAZOLE 500 MG TABS: 500 | 7 days supply | Qty: 14 | Fill #0

## 2015-08-30 NOTE — Telephone Encounter (Signed)
Interpreter line used Darlene Salazar ID# [phone] Tried to contact patient about her labs Patient not available Message left on voice mail to return our call [facility]

## 2015-08-30 NOTE — Telephone Encounter (Signed)
-----   Message from Lance Bosch, NP sent at 08/29/2015 10:02 PM EDT ----- Patient positive for Bacterial Vaginosis . Please explain this is not a STD, but a imbalance of the vaginal pH. Please send Flagyl 500 mg BID for 7 days. No refills, no alcohol while on this medication.

## 2015-08-30 NOTE — Telephone Encounter (Signed)
Pt. Returned call. Please f/u with pt. °

## 2015-08-30 NOTE — Telephone Encounter (Signed)
Interpreter line used Darlene Salazar [phone] Returned patient phone call Patient is aware of her positive results for BV And to pick up the medication at the pharmacy

## 2015-09-06 ENCOUNTER — Telehealth: Payer: Self-pay

## 2015-09-06 NOTE — Telephone Encounter (Signed)
-----   Message from Lance Bosch, NP sent at 09/06/2015  3:30 PM EDT ----- Normal pap cytology. May repeat in 3 years

## 2015-09-06 NOTE — Telephone Encounter (Signed)
Interpreter line used Darlene Salazar ID# [phone] Tried to contact patient  Patient not available Message left on machine to return our call

## 2015-09-23 ENCOUNTER — Ambulatory Visit: Payer: Self-pay | Attending: Internal Medicine

## 2015-09-26 ENCOUNTER — Ambulatory Visit (INDEPENDENT_AMBULATORY_CARE_PROVIDER_SITE_OTHER): Payer: Self-pay | Admitting: Obstetrics & Gynecology

## 2015-09-26 VITALS — BP 143/90 | HR 85 | Wt 185.6 lb

## 2015-09-26 DIAGNOSIS — D250 Submucous leiomyoma of uterus: Secondary | ICD-10-CM

## 2015-09-26 NOTE — Progress Notes (Signed)
   Subjective:    Patient ID: Darlene Salazar, female    DOB: [birth year], 45 y.o.   MRN: [id number]  HPI  45 yo MH P3 here for followup of her DUB. She saw Dr. Toney Rakes 2/17 and he did a pap smear and EMBX (negative pathology). She would have followed up with him but reports that he is on an extended trip to [place]. She is not using contraception but doesn't want another pregnancy. She is aware of how pregnancy occurs. I have encouraged condoms prn.   Review of Systems     Objective:   Physical Exam WNWHHFNAD Breathing, conversing, and ambulating normally      Assessment & Plan:  DUB with negative EMBX- schedule gyn u/s

## 2015-09-28 ENCOUNTER — Ambulatory Visit (HOSPITAL_COMMUNITY)
Admission: RE | Admit: 2015-09-28 | Discharge: 2015-09-28 | Disposition: A | Discharge status: Home / Self Care | Payer: Self-pay | Source: Ambulatory Visit | Attending: Obstetrics & Gynecology | Admitting: Obstetrics & Gynecology

## 2015-09-28 DIAGNOSIS — N938 Other specified abnormal uterine and vaginal bleeding: Secondary | ICD-10-CM | POA: Insufficient documentation

## 2015-09-28 DIAGNOSIS — D250 Submucous leiomyoma of uterus: Secondary | ICD-10-CM | POA: Insufficient documentation

## 2015-10-12 ENCOUNTER — Encounter: Payer: Self-pay | Admitting: Obstetrics & Gynecology

## 2015-10-12 ENCOUNTER — Ambulatory Visit (INDEPENDENT_AMBULATORY_CARE_PROVIDER_SITE_OTHER): Payer: Self-pay | Admitting: Obstetrics & Gynecology

## 2015-10-12 VITALS — BP 131/78 | HR 102 | Ht 60.0 in | Wt 188.4 lb

## 2015-10-12 DIAGNOSIS — N920 Excessive and frequent menstruation with regular cycle: Secondary | ICD-10-CM

## 2015-10-12 DIAGNOSIS — N946 Dysmenorrhea, unspecified: Secondary | ICD-10-CM

## 2015-10-12 DIAGNOSIS — D250 Submucous leiomyoma of uterus: Secondary | ICD-10-CM

## 2015-10-12 NOTE — Progress Notes (Signed)
   Subjective:    Patient ID: Darlene Salazar, female    DOB: [birth year], 45 y.o.   MRN: [id number]  HPI 45 yo MH P3 here for follow up after her u/s done for menorrhagia and anemia. Today she complains of pelvic pain that causes her to have to bend over with walking.   Review of Systems     Objective:   Physical Exam  WNWHHF, obese, NAD Abd- obese, benign NSSA, NT, minimal prolapse      Assessment & Plan:  Submucosal fibroid 4 cm with resulting pain and anemia and menorrhagia- I have suggested depo provera but she has been using this and is distinctly unhappy with the associated weight gain. I have discussed and offered a LAVH. She would like this option. She will appy for financial aid. I will email [place]

## 2015-10-12 NOTE — Progress Notes (Signed)
Video interpreter [phone]

## 2015-10-14 ENCOUNTER — Encounter (HOSPITAL_COMMUNITY): Payer: Self-pay | Admitting: *Deleted

## 2015-11-17 NOTE — Patient Instructions (Signed)
Your procedure is scheduled on:  Tuesday, November 28, 2015  Enter through the Main Entrance of [facility] at:  8:30 AM  Pick up the phone at the desk and dial [phone].  Call this number if you have problems the morning of surgery: [phone].  Remember: Do NOT eat food or drink after:  Midnight Monday  Take these medicines the morning of surgery with a SIP OF WATER:  None  Do NOT wear jewelry (body piercing), metal hair clips/bobby pins, make-up, or nail polish. Do NOT wear lotions, powders, or perfumes.  You may wear deodorant. Do NOT shave for 48 hours prior to surgery. Do NOT bring valuables to the hospital. Contacts, dentures, or bridgework may not be worn into surgery.  Leave suitcase in car.  After surgery it may be brought to your room.  For patients admitted to the hospital, checkout time is 11:00 AM the day of discharge.  Instrucciones:  Su cirugia esta programada para-( your procedure is scheduled on) : Martes, Junio 12, 2017  Entre por la entrada principal a la(s) -(enter through the main entrance at):  8:30 AM [place],  [place] el [phone] e informenos de su llegada ( pick up phone, dial [phone] on arrival)  Por favor llame al [phone] si tiene algun problema la [place] ( please call [phone] if you have any problems the morning of surgery.)  Recuerde: (Remember)  No coma alimentos ni tome liquidos, incluyendo agua, despues de la medianoche del lunes   ( Do not eat food or drink liquids including water after midnight on Monday  Tome estas medicinas la manana de la cirugia con un sorbito de agua (take these meds the morning of surgery with a SIP of water) None  Puede cepillarse los dientes en la manana de la [place]. (you may brush your teeth the morning of surgery)  NO use joyas, maquillaje de ojos, lapiz labial, crema para el cuerpo o esmalte de unas oscuro - las unas de los pies pueden estar pintados. ( Do not wear jewelry, eye makeup,  lipstick, body lotion, or dark fingernail polish)  Puede usar desodorante ( you may wear deodorant)  Si va a ser ingresado despues de las [place], deje la [place] en el carro hasta que se le haya asignado una habitacion. ( If you are to be admitted after surgery, leave suitcase in car until your room has been assigned.)  A los pacientes que se les de de alta el mismo dia no se les permitira manejar a casa.  ( Patients discharged on the day of surgery will not be allowed to drive home)  Use ropa suelta y comoda de regreso a Manufacturing engineer. ( wear loose comfortable clothes for ride home)  [place] (patient signature) ______________________________________

## 2015-11-18 ENCOUNTER — Encounter (HOSPITAL_COMMUNITY): Payer: Self-pay

## 2015-11-18 ENCOUNTER — Encounter (HOSPITAL_COMMUNITY)
Admission: RE | Admit: 2015-11-18 | Discharge: 2015-11-18 | Disposition: A | Discharge status: Home / Self Care | Payer: Self-pay | Source: Ambulatory Visit | Attending: Obstetrics & Gynecology | Admitting: Obstetrics & Gynecology

## 2015-11-18 DIAGNOSIS — Z01812 Encounter for preprocedural laboratory examination: Secondary | ICD-10-CM | POA: Insufficient documentation

## 2015-11-18 HISTORY — DX: Major depressive disorder, single episode, unspecified: F32.9

## 2015-11-18 HISTORY — DX: Depression, unspecified: F32.A

## 2015-11-18 HISTORY — DX: Anemia, unspecified: D64.9

## 2015-11-18 HISTORY — DX: Reserved for inherently not codable concepts without codable children: IMO0001

## 2015-11-18 LAB — CBC
HEMATOCRIT: 32.6 % — AB (ref 36.0–46.0)
HEMOGLOBIN: 10.2 g/dL — AB (ref 12.0–15.0)
MCH: 22.5 pg — ABNORMAL LOW (ref 26.0–34.0)
MCHC: 31.3 g/dL (ref 30.0–36.0)
MCV: 71.8 fL — ABNORMAL LOW (ref 78.0–100.0)
Platelets: 494 10*3/uL — ABNORMAL HIGH (ref 150–400)
RBC: 4.54 MIL/uL (ref 3.87–5.11)
RDW: 17.6 % — ABNORMAL HIGH (ref 11.5–15.5)
WBC: 9.7 10*3/uL (ref 4.0–10.5)

## 2015-11-18 LAB — TYPE AND SCREEN
ABO/RH(D): A POS
Antibody Screen: NEGATIVE

## 2015-11-18 LAB — ABO/RH: ABO/RH(D): A POS

## 2015-11-29 ENCOUNTER — Ambulatory Visit (HOSPITAL_COMMUNITY): Payer: Self-pay | Admitting: Anesthesiology

## 2015-11-29 ENCOUNTER — Encounter (HOSPITAL_COMMUNITY): Payer: Self-pay | Admitting: Obstetrics & Gynecology

## 2015-11-29 ENCOUNTER — Encounter (HOSPITAL_COMMUNITY): Payer: Self-pay

## 2015-11-29 ENCOUNTER — Observation Stay (HOSPITAL_COMMUNITY)
Admission: RE | Admit: 2015-11-29 | Discharge: 2015-11-30 | Disposition: A | Discharge status: Home / Self Care | Payer: Self-pay | Source: Ambulatory Visit | Attending: Obstetrics & Gynecology | Admitting: Obstetrics & Gynecology

## 2015-11-29 ENCOUNTER — Encounter (HOSPITAL_COMMUNITY)
Admission: RE | Disposition: A | Discharge status: Home / Self Care | Payer: Self-pay | Source: Ambulatory Visit | Attending: Obstetrics & Gynecology

## 2015-11-29 DIAGNOSIS — Z9889 Other specified postprocedural states: Principal | ICD-10-CM

## 2015-11-29 DIAGNOSIS — I10 Essential (primary) hypertension: Secondary | ICD-10-CM | POA: Insufficient documentation

## 2015-11-29 HISTORY — PX: LAPAROSCOPIC VAGINAL HYSTERECTOMY WITH SALPINGECTOMY: SHX6680

## 2015-11-29 LAB — PREGNANCY, URINE: Preg Test, Ur: NEGATIVE

## 2015-11-29 SURGERY — HYSTERECTOMY, VAGINAL, LAPAROSCOPY-ASSISTED, WITH SALPINGECTOMY
Anesthesia: General | Site: Vagina | Laterality: Bilateral

## 2015-11-29 MED ORDER — SUGAMMADEX SODIUM 200 MG/2ML IV SOLN
INTRAVENOUS | Status: DC | PRN
  Administered 2015-11-29: 200 mg via INTRAVENOUS

## 2015-11-29 MED ORDER — HYDROMORPHONE HCL 1 MG/ML IJ SOLN
0.2500 mg | INTRAMUSCULAR | Status: DC | PRN
  Administered 2015-11-29 (×2): .5 mg via INTRAVENOUS
  Administered 2015-11-29 (×2): 0.5 mg via INTRAVENOUS

## 2015-11-29 MED ORDER — OXYCODONE-ACETAMINOPHEN 5-325 MG PO TABS
1.0000 | ORAL_TABLET | ORAL | Status: DC | PRN
  Administered 2015-11-30 (×2): 2 via ORAL
  Filled 2015-11-29 (×2): qty 2

## 2015-11-29 MED ORDER — SODIUM CHLORIDE 0.9 % IJ SOLN
INTRAMUSCULAR | Status: AC
  Filled 2015-11-29: qty 100

## 2015-11-29 MED ORDER — DEXAMETHASONE SODIUM PHOSPHATE 4 MG/ML IJ SOLN
INTRAMUSCULAR | Status: AC
  Filled 2015-11-29: qty 1

## 2015-11-29 MED ORDER — LIDOCAINE HCL (CARDIAC) 20 MG/ML IV SOLN
INTRAVENOUS | Status: DC | PRN
  Administered 2015-11-29: 50 mg via INTRAVENOUS

## 2015-11-29 MED ORDER — LIDOCAINE HCL (PF) 1 % IJ SOLN
INTRAMUSCULAR | Status: AC
  Filled 2015-11-29: qty 5

## 2015-11-29 MED ORDER — MIDAZOLAM HCL 2 MG/2ML IJ SOLN
INTRAMUSCULAR | Status: AC
  Filled 2015-11-29: qty 2

## 2015-11-29 MED ORDER — DEXAMETHASONE SODIUM PHOSPHATE 4 MG/ML IJ SOLN
INTRAMUSCULAR | Status: DC | PRN
  Administered 2015-11-29: 4 mg via INTRAVENOUS

## 2015-11-29 MED ORDER — PROPOFOL 10 MG/ML IV BOLUS
INTRAVENOUS | Status: DC | PRN
  Administered 2015-11-29: 150 mg via INTRAVENOUS

## 2015-11-29 MED ORDER — BUPIVACAINE HCL (PF) 0.5 % IJ SOLN
INTRAMUSCULAR | Status: AC
  Filled 2015-11-29: qty 30

## 2015-11-29 MED ORDER — PROMETHAZINE HCL 25 MG/ML IJ SOLN: 6.2500 mg | INTRAMUSCULAR | Status: DC | PRN

## 2015-11-29 MED ORDER — ONDANSETRON HCL 4 MG PO TABS: 4.0000 mg | ORAL_TABLET | Freq: Four times a day (QID) | ORAL | Status: DC | PRN

## 2015-11-29 MED ORDER — ONDANSETRON HCL 4 MG/2ML IJ SOLN: 4.0000 mg | Freq: Four times a day (QID) | INTRAMUSCULAR | Status: DC | PRN

## 2015-11-29 MED ORDER — SCOPOLAMINE 1 MG/3DAYS TD PT72
MEDICATED_PATCH | TRANSDERMAL | Status: AC
  Filled 2015-11-29: qty 1

## 2015-11-29 MED ORDER — LACTATED RINGERS IV SOLN: INTRAVENOUS | Status: DC

## 2015-11-29 MED ORDER — LACTATED RINGERS IV SOLN
INTRAVENOUS | Status: DC
  Administered 2015-11-29: 09:00:00 via INTRAVENOUS
  Administered 2015-11-29: 125 mL/h via INTRAVENOUS
  Administered 2015-11-29 (×2): via INTRAVENOUS

## 2015-11-29 MED ORDER — ONDANSETRON HCL 4 MG/2ML IJ SOLN
INTRAMUSCULAR | Status: DC | PRN
  Administered 2015-11-29: 4 mg via INTRAVENOUS

## 2015-11-29 MED ORDER — PROPOFOL 10 MG/ML IV BOLUS
INTRAVENOUS | Status: AC
  Filled 2015-11-29: qty 20

## 2015-11-29 MED ORDER — IBUPROFEN 800 MG PO TABS
800.0000 mg | ORAL_TABLET | Freq: Three times a day (TID) | ORAL | Status: DC | PRN
  Administered 2015-11-29: 800 mg via ORAL
  Filled 2015-11-29: qty 1

## 2015-11-29 MED ORDER — ONDANSETRON HCL 4 MG/2ML IJ SOLN
INTRAMUSCULAR | Status: AC
  Filled 2015-11-29: qty 2

## 2015-11-29 MED ORDER — HYDROCODONE-ACETAMINOPHEN 7.5-325 MG PO TABS: 1.0000 | ORAL_TABLET | Freq: Once | ORAL | Status: DC | PRN

## 2015-11-29 MED ORDER — HYDROMORPHONE HCL 1 MG/ML IJ SOLN
0.2000 mg | INTRAMUSCULAR | Status: DC | PRN
  Administered 2015-11-29 (×4): 0.6 mg via INTRAVENOUS
  Filled 2015-11-29 (×4): qty 1

## 2015-11-29 MED ORDER — BUPIVACAINE HCL (PF) 0.5 % IJ SOLN
INTRAMUSCULAR | Status: DC | PRN
  Administered 2015-11-29: 5 mL

## 2015-11-29 MED ORDER — FENTANYL CITRATE (PF) 250 MCG/5ML IJ SOLN
INTRAMUSCULAR | Status: AC
  Filled 2015-11-29: qty 5

## 2015-11-29 MED ORDER — CEFAZOLIN SODIUM-DEXTROSE 2-4 GM/100ML-% IV SOLN
2.0000 g | INTRAVENOUS | Status: AC
  Administered 2015-11-29: 2 g via INTRAVENOUS

## 2015-11-29 MED ORDER — BUPIVACAINE-EPINEPHRINE 0.5% -1:200000 IJ SOLN
INTRAMUSCULAR | Status: DC | PRN
  Administered 2015-11-29: 10 mL

## 2015-11-29 MED ORDER — SODIUM CHLORIDE 0.9 % IJ SOLN
INTRAMUSCULAR | Status: DC | PRN
  Administered 2015-11-29: 10 mL

## 2015-11-29 MED ORDER — KETOROLAC TROMETHAMINE 30 MG/ML IJ SOLN
INTRAMUSCULAR | Status: AC
  Filled 2015-11-29: qty 1

## 2015-11-29 MED ORDER — KETOROLAC TROMETHAMINE 30 MG/ML IJ SOLN: 30.0000 mg | Freq: Once | INTRAMUSCULAR | Status: DC | PRN

## 2015-11-29 MED ORDER — FENTANYL CITRATE (PF) 100 MCG/2ML IJ SOLN
INTRAMUSCULAR | Status: DC | PRN
  Administered 2015-11-29 (×3): 50 ug via INTRAVENOUS
  Administered 2015-11-29: 100 ug via INTRAVENOUS

## 2015-11-29 MED ORDER — HYDROMORPHONE HCL 1 MG/ML IJ SOLN
INTRAMUSCULAR | Status: AC
  Administered 2015-11-29: 0.5 mg via INTRAVENOUS
  Filled 2015-11-29: qty 1

## 2015-11-29 MED ORDER — LACTATED RINGERS IR SOLN
Status: DC | PRN
Start: 2015-11-29 — End: 2015-11-29
  Administered 2015-11-29: 3000 mL

## 2015-11-29 MED ORDER — MIDAZOLAM HCL 5 MG/5ML IJ SOLN
INTRAMUSCULAR | Status: DC | PRN
  Administered 2015-11-29 (×2): 1 mg via INTRAVENOUS

## 2015-11-29 MED ORDER — ROCURONIUM BROMIDE 100 MG/10ML IV SOLN
INTRAVENOUS | Status: DC | PRN
  Administered 2015-11-29: 40 mg via INTRAVENOUS
  Administered 2015-11-29: 10 mg via INTRAVENOUS

## 2015-11-29 MED ORDER — SUGAMMADEX SODIUM 200 MG/2ML IV SOLN
INTRAVENOUS | Status: AC
  Filled 2015-11-29: qty 2

## 2015-11-29 MED ORDER — SCOPOLAMINE 1 MG/3DAYS TD PT72
1.0000 | MEDICATED_PATCH | Freq: Once | TRANSDERMAL | Status: DC
  Administered 2015-11-29: 1.5 mg via TRANSDERMAL

## 2015-11-29 MED ORDER — BUPIVACAINE-EPINEPHRINE (PF) 0.5% -1:200000 IJ SOLN
INTRAMUSCULAR | Status: AC
  Filled 2015-11-29: qty 30

## 2015-11-29 MED ORDER — HYDROMORPHONE HCL 1 MG/ML IJ SOLN
INTRAMUSCULAR | Status: AC
  Filled 2015-11-29: qty 1

## 2015-11-29 SURGICAL SUPPLY — 38 items
APPLICATOR COTTON TIP 6IN STRL (MISCELLANEOUS) IMPLANT
CANISTER SUCT 3000ML (MISCELLANEOUS) ×3 IMPLANT
CLOTH BEACON ORANGE TIMEOUT ST (SAFETY) ×3 IMPLANT
CONT PATH 16OZ SNAP LID 3702 (MISCELLANEOUS) ×3 IMPLANT
COUNTER NEEDLE 1200 MAGNETIC (NEEDLE) ×3 IMPLANT
DECANTER SPIKE VIAL GLASS SM (MISCELLANEOUS) ×6 IMPLANT
DRSG COVADERM PLUS 2X2 (GAUZE/BANDAGES/DRESSINGS) ×6 IMPLANT
DRSG OPSITE POSTOP 3X4 (GAUZE/BANDAGES/DRESSINGS) ×3 IMPLANT
DURAPREP 26ML APPLICATOR (WOUND CARE) ×3 IMPLANT
ELECT REM PT RETURN 9FT ADLT (ELECTROSURGICAL) ×3
ELECTRODE REM PT RTRN 9FT ADLT (ELECTROSURGICAL) ×1 IMPLANT
GLOVE BIO SURGEON STRL SZ 6.5 (GLOVE) ×4 IMPLANT
GLOVE BIO SURGEONS STRL SZ 6.5 (GLOVE) ×2
GLOVE BIOGEL PI IND STRL 7.0 (GLOVE) ×3 IMPLANT
GLOVE BIOGEL PI INDICATOR 7.0 (GLOVE) ×6
LEGGING LITHOTOMY PAIR STRL (DRAPES) ×3 IMPLANT
NDL SAFETY ECLIPSE 18X1.5 (NEEDLE) ×1 IMPLANT
NEEDLE HYPO 18GX1.5 SHARP (NEEDLE) ×2
NEEDLE INSUFFLATION 120MM (ENDOMECHANICALS) ×3 IMPLANT
NEEDLE SPNL 18GX3.5 QUINCKE PK (NEEDLE) ×3 IMPLANT
NS IRRIG 1000ML POUR BTL (IV SOLUTION) ×3 IMPLANT
PACK LAVH (CUSTOM PROCEDURE TRAY) ×3 IMPLANT
PACK ROBOTIC GOWN (GOWN DISPOSABLE) ×3 IMPLANT
PAD TRENDELENBURG POSITION (MISCELLANEOUS) ×3 IMPLANT
SET IRRIG TUBING LAPAROSCOPIC (IRRIGATION / IRRIGATOR) IMPLANT
SHEARS HARMONIC ACE PLUS 36CM (ENDOMECHANICALS) ×3 IMPLANT
SLEEVE XCEL OPT CAN 5 100 (ENDOMECHANICALS) ×3 IMPLANT
SUT VIC AB 0 CT1 36 (SUTURE) ×3 IMPLANT
SUT VIC AB 2-0 CT1 18 (SUTURE) ×6 IMPLANT
SUT VICRYL 0 UR6 27IN ABS (SUTURE) ×6 IMPLANT
SUT VICRYL 4-0 PS2 18IN ABS (SUTURE) ×6 IMPLANT
SYR 30ML LL (SYRINGE) ×3 IMPLANT
SYR BULB IRRIGATION 50ML (SYRINGE) ×3 IMPLANT
TOWEL OR 17X24 6PK STRL BLUE (TOWEL DISPOSABLE) ×9 IMPLANT
TRAY FOLEY CATH SILVER 14FR (SET/KITS/TRAYS/PACK) ×3 IMPLANT
TROCAR OPTI TIP 5M 100M (ENDOMECHANICALS) ×3 IMPLANT
TROCAR XCEL DIL TIP R 11M (ENDOMECHANICALS) ×3 IMPLANT
WARMER LAPAROSCOPE (MISCELLANEOUS) ×3 IMPLANT

## 2015-11-29 NOTE — Op Note (Addendum)
11/29/2015  11:57 AM  PATIENT:  Darlene Salazar  45 y.o. female  PRE-OPERATIVE DIAGNOSIS:  Menorrhagia, anemia and pelvic pain - submucosal fibroid  POST-OPERATIVE DIAGNOSIS:  same  PROCEDURE:  Procedure(s): LAPAROSCOPIC ASSISTED VAGINAL HYSTERECTOMY WITH SALPINGECTOMY (Bilateral)  SURGEON:  Surgeon(s) and Role:    * Emily Filbert, MD - Primary    * Mora Bellman, MD - Assisting  ANESTHESIA:   general  EBL:  Total I/O In: 2000 [I.V.:2000] Out: 900 [Urine:400; Blood:500]  BLOOD ADMINISTERED:none  DRAINS: none   LOCAL MEDICATIONS USED:  MARCAINE     SPECIMEN:  Source of Specimen:  uterus and tubes  DISPOSITION OF SPECIMEN:  PATHOLOGY  COUNTS:  YES  TOURNIQUET:  * No tourniquets in log *  DICTATION: .Dragon Dictation  PLAN OF CARE: Admit for overnight observation  PATIENT DISPOSITION:  PACU - hemodynamically stable.   Delay start of Pharmacological VTE agent (>24hrs) due to surgical blood loss or risk of bleeding: not applicable  The risks, benefits, and alternatives of surgery were explained, accepted, and understood. Consents were signed. She was taken to the operating room and placed in the dorsal lithotomy position. When she was comfortable, general anesthesia was applied without complication. Her abdomen and vagina were prepped and draped in the usual sterile fashion. A bimanual exam revealed a 10 week size boggy uterus, her adnexa were not palpable. A Hulka manipulator was placed. A Foley catheter was placed and a drained clear urine throughout the case. Gloves were changed and attention was turned to the abdomen.A 10 mm incision was made in the umbilicus A Veress needle was placed intraperitoneally. Low-flow CO2 was used to insufflate the abdomen to approximately 3 1/2 L. After an excellent pneumoperitoneum was established a 10 mm trocar was placed. Laparoscopy confirmed correct placement. Under direct laparoscopic visualization I placed a 5 mm port in each lower  quadrant. She was placed in the Trendelenburg position. The pelvis was inspected. Both adnexa were very small. We used a harmonic scalpel to separate the oviducts from their pelvic attatchments.   I then ligated the uteroovarian ligaments on each side. The round ligaments were ligated with the harmonic scalpel as well. I then moved to the vaginal approach. I then moved to the vaginal approach. I used a solution of 30cc of 0.5% marcaine with 100 cc of normal saline to hydrodisect at the cervicovaginal junction. I made a circumferential incision at the site. The posterior peritoneum was entered sharply and a long weighted speculum was placed.  2-0 vicryl suture is used throughout unless otherwise specified. I entered the anterior peritoneum as well. I then continued to separate the uterus from its pelvic attachments. The uterus still had some attatchments that I could not safely reach from the vaginal approach, so I returned to the laparoscopic approach and Released these attatchments. The uterus was removed. Excellent hemostasis was maintained throughout.  I closed the vaginal cuff in a horizontal fashion with a 0 vicryl suture in a running, locking fashion. I then returned to the laparoscopic portion of the case. I used the [employer] device to close the umbilical fascia. The CO2 was allowed to escape from the abdomen. No defects were palpable. Dermabond was used to close the incisions. Steri-Strips were placed across the incision. She was extubated and taken to the recovery room in stable condition.

## 2015-11-29 NOTE — Addendum Note (Signed)
Addendum  created 11/29/15 1624 by Flossie Dibble, CRNA   Modules edited: Notes Section   Notes Section:  File: [id number]

## 2015-11-29 NOTE — Anesthesia Postprocedure Evaluation (Signed)
Anesthesia Post Note  Patient: Darlene Salazar  Procedure(s) Performed: Procedure(s) (LRB): LAPAROSCOPIC ASSISTED VAGINAL HYSTERECTOMY WITH SALPINGECTOMY (Bilateral)  Patient location during evaluation: [facility] Anesthesia Type: General Level of consciousness: awake and alert Pain management: satisfactory to patient Vital Signs Assessment: post-procedure vital signs reviewed and stable Respiratory status: spontaneous breathing and respiratory function stable Cardiovascular status: stable Postop Assessment: adequate PO intake Anesthetic complications: no     Last Vitals:  Filed Vitals:   11/29/15 1330 11/29/15 1405  BP: 111/63 118/61  Pulse: 84 86  Temp: 37.1 C 36.6 C  Resp: 19 18    Last Pain:  Filed Vitals:   11/29/15 1618  PainSc: 8    Pain Goal: Patients Stated Pain Goal: 0 (11/29/15 1546)               Kenlea Woodell

## 2015-11-29 NOTE — OR Nursing (Signed)
Telephone report given to [employer]- pt transported to room by house coverage leanne rn.

## 2015-11-29 NOTE — Anesthesia Procedure Notes (Signed)
Procedure Name: Intubation Date/Time: 11/29/2015 9:51 AM Performed by: Bufford Spikes Pre-anesthesia Checklist: Patient identified, Timeout performed, Emergency Drugs available, Suction available and Patient being monitored Patient Re-evaluated:Patient Re-evaluated prior to inductionOxygen Delivery Method: Circle system utilized Preoxygenation: Pre-oxygenation with 100% oxygen Intubation Type: IV induction Ventilation: Mask ventilation without difficulty Laryngoscope Size: Miller and 2 Tube type: Oral Tube size: 7.0 mm Number of attempts: 1 Airway Equipment and Method: Stylet Placement Confirmation: ETT inserted through vocal cords under direct vision,  breath sounds checked- equal and bilateral,  positive ETCO2 and CO2 detector Secured at: 21 cm Tube secured with: Tape Dental Injury: Teeth and Oropharynx as per pre-operative assessment

## 2015-11-29 NOTE — Anesthesia Postprocedure Evaluation (Signed)
Anesthesia Post Note  Patient: Darlene Salazar  Procedure(s) Performed: Procedure(s) (LRB): LAPAROSCOPIC ASSISTED VAGINAL HYSTERECTOMY WITH SALPINGECTOMY (Bilateral)  Patient location during evaluation: PACU Anesthesia Type: General Level of consciousness: awake and alert and oriented Pain management: pain level controlled Vital Signs Assessment: post-procedure vital signs reviewed and stable Respiratory status: nonlabored ventilation, spontaneous breathing and respiratory function stable Cardiovascular status: blood pressure returned to baseline and stable Postop Assessment: no signs of nausea or vomiting Anesthetic complications: no     Last Vitals:  Filed Vitals:   11/29/15 1315 11/29/15 1330  BP: 110/63 111/63  Pulse: 86 84  Temp:  37.1 C  Resp: 18 19    Last Pain:  Filed Vitals:   11/29/15 1337  PainSc: Asleep   Pain Goal: Patients Stated Pain Goal: 3 (11/29/15 0852)               Jorryn Casagrande A.

## 2015-11-29 NOTE — H&P (Signed)
Darlene Salazar is an 45 y.o. MHP3 (21,18, and 52 yo kids) here for treatment of her heavy long periods. An u/s shows a 2.8 cm submucosal fibroid. Her pap and EMBX are normal. Her hbg is 10.2. She declines alternative treatments and wants her uterus removed. She has tried OCPs with no help. She has 2 "periods" per month.     No LMP recorded.    Past Medical History  Diagnosis Date  . Menorrhagia   . High cholesterol   . Hypertension     no med, patient denies  . Anemia   . Depression   . Shortness of breath dyspnea     with exertion    Past Surgical History  Procedure Laterality Date  . Cholecystectomy    . Wisdom tooth extraction      Family History  Problem Relation Age of Onset  . Diabetes Father   . Hyperlipidemia Father   . Hypertension Father   . Cancer Mother 19    uterine  . Diabetes Brother     Social History:  reports that she has never smoked. She has never used smokeless tobacco. She reports that she does not drink alcohol or use illicit drugs.  Allergies: No Known Allergies  Prescriptions prior to admission  Medication Sig Dispense Refill Last Dose  . naproxen sodium (ALEVE) 220 MG tablet Take 440 mg by mouth 2 (two) times daily as needed (for pain).   Past Week at Unknown time  . atorvastatin (LIPITOR) 10 MG tablet Take 1 tablet (10 mg total) by mouth daily. (Patient not taking: Reported on 09/26/2015) 90 tablet 3 Not Taking at Unknown time  . ferrous sulfate 325 (65 FE) MG tablet Take 1 tablet (325 mg total) by mouth daily with breakfast. (Patient not taking: Reported on 11/15/2015) 30 tablet 1 Not Taking at Unknown time  . HYDROcodone-acetaminophen (NORCO/VICODIN) 5-325 MG tablet Take 1-2 tablets by mouth every 6 (six) hours as needed for moderate pain. (Patient not taking: Reported on 09/26/2015) 10 tablet 0 Not Taking at Unknown time  . metroNIDAZOLE (FLAGYL) 500 MG tablet Take 1 tablet (500 mg total) by mouth 2 (two) times daily. (Patient not  taking: Reported on 09/26/2015) 14 tablet 0 Not Taking at Unknown time  . norethindrone (MICRONOR,CAMILA,ERRIN) 0.35 MG tablet Take 1 tablet (0.35 mg total) by mouth daily. (Patient not taking: Reported on 07/28/2015) 1 Package 11 Not Taking at Unknown time  . ondansetron (ZOFRAN ODT) 4 MG disintegrating tablet Take 1 tablet (4 mg total) by mouth every 8 (eight) hours as needed for nausea or vomiting. (Patient not taking: Reported on 09/26/2015) 10 tablet 1 Not Taking at Unknown time  . promethazine (PHENERGAN) 25 MG tablet Take 1 tablet (25 mg total) by mouth every 6 (six) hours as needed for nausea or vomiting. (Patient not taking: Reported on 09/26/2015) 12 tablet 1 Not Taking at Unknown time    ROS  She does not want more kids but does not use contraception. She is a Agricultural engineer.  Blood pressure 116/92, pulse 95, temperature 98.1 F (36.7 C), temperature source Oral, resp. rate 16, SpO2 100 %. Physical Exam  Heart- rrr Lungs- CTAB Abd- benign  Results for orders placed or performed during the hospital encounter of 11/29/15 (from the past 24 hour(s))  Pregnancy, urine     Status: None   Collection Time: 11/29/15  8:38 AM  Result Value Ref Range   Preg Test, Ur NEGATIVE NEGATIVE    No results found.  Assessment/Plan:  DUB/fibroids/anemia- plan for LAVH/BS.  She understands the risks of surgery, including, but not to infection, bleeding, DVTs, damage to bowel, bladder, ureters. She wishes to proceed.     Emily Filbert 11/29/2015, 9:13 AM

## 2015-11-29 NOTE — Transfer of Care (Signed)
Immediate Anesthesia Transfer of Care Note  Patient: Darlene Salazar  Procedure(s) Performed: Procedure(s): LAPAROSCOPIC ASSISTED VAGINAL HYSTERECTOMY WITH SALPINGECTOMY (Bilateral)  Patient Location: PACU  Anesthesia Type:General  Level of Consciousness: awake, alert  and oriented  Airway & Oxygen Therapy: Patient Spontanous Breathing and Patient connected to nasal cannula oxygen  Post-op Assessment: Report given to RN and Post -op Vital signs reviewed and stable  Post vital signs: Reviewed and stable  Last Vitals:  Filed Vitals:   11/29/15 0852  BP: 116/92  Pulse: 95  Temp: 36.7 C  Resp: 16    Last Pain: There were no vitals filed for this visit.    Patients Stated Pain Goal: 3 ([id number])  Complications: No apparent anesthesia complications

## 2015-11-29 NOTE — Anesthesia Preprocedure Evaluation (Addendum)
Anesthesia Evaluation  Patient identified by MRN, date of birth, ID band Patient awake    Reviewed: Allergy & Precautions, NPO status , Patient's Chart, lab work & pertinent test results  Airway Mallampati: III  TM Distance: >3 FB Neck ROM: Full    Dental  (+) Dental Advisory Given   Pulmonary neg pulmonary ROS,    breath sounds clear to auscultation       Cardiovascular negative cardio ROS   Rhythm:Regular Rate:Normal     Neuro/Psych negative neurological ROS     GI/Hepatic negative GI ROS, Neg liver ROS,   Endo/Other  Morbid obesity  Renal/GU negative Renal ROS     Musculoskeletal   Abdominal   Peds  Hematology  (+) anemia ,   Anesthesia Other Findings   Reproductive/Obstetrics                            Lab Results  Component Value Date   WBC 9.7 11/18/2015   HGB 10.2* 11/18/2015   HCT 32.6* 11/18/2015   MCV 71.8* 11/18/2015   PLT 494* 11/18/2015   Lab Results  Component Value Date   CREATININE 0.72 08/21/2015   BUN 11 08/21/2015   NA 135 08/21/2015   K 3.9 08/21/2015   CL 102 08/21/2015   CO2 25 08/21/2015    Anesthesia Physical Anesthesia Plan  ASA: III  Anesthesia Plan: General   Post-op Pain Management:    Induction: Intravenous  Airway Management Planned: Oral ETT  Additional Equipment:   Intra-op Plan:   Post-operative Plan: Extubation in OR  Informed Consent: I have reviewed the patients History and Physical, chart, labs and discussed the procedure including the risks, benefits and alternatives for the proposed anesthesia with the patient or authorized representative who has indicated his/her understanding and acceptance.   Dental advisory given  Plan Discussed with: CRNA  Anesthesia Plan Comments:         Anesthesia Quick Evaluation

## 2015-11-29 NOTE — OR Nursing (Signed)
eda-spanish interpretor comes to [employer]

## 2015-11-30 ENCOUNTER — Encounter (HOSPITAL_COMMUNITY): Payer: Self-pay | Admitting: Obstetrics & Gynecology

## 2015-11-30 DIAGNOSIS — D500 Iron deficiency anemia secondary to blood loss (chronic): Secondary | ICD-10-CM

## 2015-11-30 DIAGNOSIS — D250 Submucous leiomyoma of uterus: Secondary | ICD-10-CM

## 2015-11-30 DIAGNOSIS — N938 Other specified abnormal uterine and vaginal bleeding: Secondary | ICD-10-CM

## 2015-11-30 LAB — CBC
HCT: 23.9 % — ABNORMAL LOW (ref 36.0–46.0)
HEMOGLOBIN: 7.6 g/dL — AB (ref 12.0–15.0)
MCH: 22.6 pg — ABNORMAL LOW (ref 26.0–34.0)
MCHC: 31.8 g/dL (ref 30.0–36.0)
MCV: 71.1 fL — ABNORMAL LOW (ref 78.0–100.0)
PLATELETS: 361 10*3/uL (ref 150–400)
RBC: 3.36 MIL/uL — ABNORMAL LOW (ref 3.87–5.11)
RDW: 17.5 % — AB (ref 11.5–15.5)
WBC: 16 10*3/uL — ABNORMAL HIGH (ref 4.0–10.5)

## 2015-11-30 MED ORDER — IBUPROFEN 800 MG PO TABS
800.0000 mg | ORAL_TABLET | Freq: Three times a day (TID) | ORAL | Status: DC | PRN
Start: 2015-11-30 — End: 2016-02-14

## 2015-11-30 MED ORDER — OXYCODONE-ACETAMINOPHEN 5-325 MG PO TABS: 1.0000 | ORAL_TABLET | ORAL | Status: DC | PRN

## 2015-11-30 MED FILL — OXYCODONE/APAP 5-325: 5-325 | 3 days supply | Qty: 30 | Fill #0

## 2015-11-30 MED FILL — IBUPROFEN 800 MG TABLET: 800 | 10 days supply | Qty: 30 | Fill #0

## 2015-11-30 NOTE — Discharge Instructions (Signed)
Histerectoma abdominal, cuidados posteriores (Abdominal Hysterectomy, Care After) Estas indicaciones le proporcionan informacin general acerca de cmo deber cuidarse despus del procedimiento. El mdico tambin podr darle instrucciones especficas. Comunquese con el mdico si tiene algn problema o tiene preguntas despus del procedimiento.  CUIDADOS EN EL HOGAR La recuperacin de esta ciruga demora de 4a 6semanas. [place] los medicamentos solamente como se lo haya indicado el mdico.  Cambie el vendaje como se lo haya indicado el mdico.  Debe ver nuevamente al mdico para que le saque los puntos.  Tome duchas durante 2a 3semanas. Pregntele al mdico cundo puede tomar duchas.  No se haga duchas vaginales, no use tampones ni tenga relaciones sexuales durante al menos 6semanas o segn las indicaciones.  Siga las indicaciones del mdico con respecto a la actividad fsica, a Surveyor, quantity, a conducir el automvil y a las actividades en general.  Descanse y duerma lo suficiente.  No levante nada que sea ms pesado que un galn de Air traffic controller (alrededor de 10libras [4.5kilogramos]) durante el primer mes posterior a la Leisure centre manager.  Retome su dieta normal segn lo indicado por el mdico.  No beba alcohol hasta que el mdico se lo autorice.  Tome un medicamento como ayuda para Management consultant (laxante) segn las indicaciones del mdico.  Los alimentos con alto contenido de fibra pueden tener un efecto laxante. Coma muchas frutas y vegetales crudos, cereales integrales y frijoles.  Beba suficiente lquido para mantener el pis (orina) claro o de color amarillo plido.  Pdale a alguna persona que la ayude con las tareas del hogar durante 1 a 2 semanas despus de la [place].  Cumpla con los controles mdicos segn las indicaciones. SOLICITE AYUDA SI:  Siente escalofros o tiene fiebre.  Tiene inflamacin, enrojecimiento o dolor en el rea del corte  (incisin).  Observa un lquido blanco amarillento (pus) que supura del corte.  Advierte un olor ftido que proviene de la herida o del vendaje.  La herida se abre.  Se siente mareada o tiene vahdos.  Siente dolor o tiene una hemorragia al [employer].  Tiene evacuaciones lquidas (diarrea) que no se interrumpen.  Tiene malestar estomacal (nuseas) o vmitos que no se interrumpen.  Le sale un lquido (secrecin) de la vagina.  Tiene una erupcin cutnea.  Tiene algn problema o sufre alguna reaccin a los medicamentos.  Necesita analgsicos ms fuertes. SOLICITE AYUDA DE INMEDIATO SI:   Tiene fiebre y los sntomas empeoran repentinamente.  Tiene mucho dolor de estmago (abdominal).  Siente dolor en el pecho.  Comienza a sentir falta de [place].  Pierde el conocimiento (se desmaya).  Siente dolor, u observa hinchazn o enrojecimiento en la pierna.  Sangra mucho por la vagina y observa grumos de tejido (cogulos). ASEGRESE DE QUE:   Comprende estas instrucciones.  Controlar su afeccin.  Recibir ayuda de inmediato si no mejora o si empeora.   Esta informacin no tiene Marine scientist el consejo del mdico. Asegrese de hacerle al mdico cualquier pregunta que tenga.   Document Released: 05/24/2011 Document Revised: 06/09/2013 Elsevier Interactive Patient Education [employer].

## 2015-11-30 NOTE — Progress Notes (Signed)
Discharge instructions reviewed with patient with [place] interpreter.  Patient states understanding of home care, medications, activity, signs/symptoms to report to MD and return MD office visit.  Patients significant other and family will assist with her care @ home.  No home  equipment needed, patient has prescriptions and all personal belongings.  Patient  Discharged via wheelchair in stable condition with staff without incident.

## 2015-11-30 NOTE — Discharge Summary (Signed)
Physician Discharge Summary  Patient ID: Darlene Salazar MRN: [id number] DOB/AGE: 11/26/1970 45 y.o.  Admit date: 11/29/2015 Discharge date: 11/30/2015  Admission Diagnoses: anemia, dub, fibroids  Discharge Diagnoses: same Active Problems:   Post-operative state   Discharged Condition: fair  Hospital Course: She underwent an uncomplicated LAVH/BS. By POD #1 she was voiding, tolerating po, passing flatus, ambulating, and voicing her readiness to go home with her husband.   Consults: None  Significant Diagnostic Studies: labs: Pre op hbg 10.2, post op 7.6  Treatments: surgery: as above  Discharge Exam: Blood pressure 95/53, pulse 74, temperature 97.7 F (36.5 C), temperature source Oral, resp. rate 17, height 4' 8.5" (1.435 m), weight 84.369 kg (186 lb), SpO2 99 %. General appearance: alert Resp: clear to auscultation bilaterally Cardio: regular rate and rhythm, S1, S2 normal, no murmur, click, rub or gallop GI: normal post op tenderness, soft, NABS Incision/Wound: C/D/I  Disposition: 01-Home or Self Care     Medication List    STOP taking these medications        ALEVE 220 MG tablet  Generic drug:  naproxen sodium     atorvastatin 10 MG tablet  Commonly known as:  LIPITOR     HYDROcodone-acetaminophen 5-325 MG tablet  Commonly known as:  NORCO/VICODIN     metroNIDAZOLE 500 MG tablet  Commonly known as:  FLAGYL     norethindrone 0.35 MG tablet  Commonly known as:  MICRONOR,CAMILA,ERRIN     ondansetron 4 MG disintegrating tablet  Commonly known as:  ZOFRAN ODT     promethazine 25 MG tablet  Commonly known as:  PHENERGAN      TAKE these medications        ferrous sulfate 325 (65 FE) MG tablet  Take 1 tablet (325 mg total) by mouth daily with breakfast.     ibuprofen 800 MG tablet  Commonly known as:  ADVIL,MOTRIN  Take 1 tablet (800 mg total) by mouth every 8 (eight) hours as needed (mild pain).     oxyCODONE-acetaminophen 5-325 MG tablet   Commonly known as:  PERCOCET/ROXICET  Take 1-2 tablets by mouth every 4 (four) hours as needed for severe pain (moderate to severe pain (when tolerating fluids)).          Miralax prn     Follow-up Information    Follow up with Emily Filbert, MD. Schedule an appointment as soon as possible for a visit in 6 weeks.   Specialty:  Obstetrics and Gynecology   Contact information:   [place] [address] [phone]       Signed: Emily Filbert 11/30/2015, 8:56 AM

## 2015-12-01 ENCOUNTER — Inpatient Hospital Stay (HOSPITAL_COMMUNITY)
Admission: AD | Admit: 2015-12-01 | Discharge: 2015-12-01 | Disposition: A | Discharge status: Home / Self Care | Payer: Self-pay | Source: Ambulatory Visit | Attending: Obstetrics & Gynecology | Admitting: Obstetrics & Gynecology

## 2015-12-01 ENCOUNTER — Encounter (HOSPITAL_COMMUNITY): Payer: Self-pay

## 2015-12-01 DIAGNOSIS — R112 Nausea with vomiting, unspecified: Secondary | ICD-10-CM

## 2015-12-01 DIAGNOSIS — Z9889 Other specified postprocedural states: Secondary | ICD-10-CM

## 2015-12-01 DIAGNOSIS — D62 Acute posthemorrhagic anemia: Secondary | ICD-10-CM | POA: Insufficient documentation

## 2015-12-01 DIAGNOSIS — R0602 Shortness of breath: Secondary | ICD-10-CM | POA: Insufficient documentation

## 2015-12-01 DIAGNOSIS — E7800 Pure hypercholesterolemia, unspecified: Secondary | ICD-10-CM | POA: Insufficient documentation

## 2015-12-01 DIAGNOSIS — F329 Major depressive disorder, single episode, unspecified: Secondary | ICD-10-CM | POA: Insufficient documentation

## 2015-12-01 LAB — CBC
HCT: 25.5 % — ABNORMAL LOW (ref 36.0–46.0)
Hemoglobin: 7.9 g/dL — ABNORMAL LOW (ref 12.0–15.0)
MCH: 22.4 pg — AB (ref 26.0–34.0)
MCHC: 31 g/dL (ref 30.0–36.0)
MCV: 72.2 fL — AB (ref 78.0–100.0)
PLATELETS: 376 10*3/uL (ref 150–400)
RBC: 3.53 MIL/uL — ABNORMAL LOW (ref 3.87–5.11)
RDW: 17.5 % — AB (ref 11.5–15.5)
WBC: 8.9 10*3/uL (ref 4.0–10.5)

## 2015-12-01 LAB — URINALYSIS, ROUTINE W REFLEX MICROSCOPIC
BILIRUBIN URINE: NEGATIVE
Glucose, UA: NEGATIVE mg/dL
Ketones, ur: NEGATIVE mg/dL
Leukocytes, UA: NEGATIVE
NITRITE: NEGATIVE
Protein, ur: NEGATIVE mg/dL
pH: 7 (ref 5.0–8.0)

## 2015-12-01 LAB — URINE MICROSCOPIC-ADD ON

## 2015-12-01 MED ORDER — FERROUS SULFATE 325 (65 FE) MG PO TABS: 325.0000 mg | ORAL_TABLET | Freq: Two times a day (BID) | ORAL | Status: DC

## 2015-12-01 MED ORDER — METOCLOPRAMIDE HCL 10 MG PO TABS: 10.0000 mg | ORAL_TABLET | Freq: Four times a day (QID) | ORAL | Status: DC

## 2015-12-01 MED ORDER — METOCLOPRAMIDE HCL 10 MG PO TABS
10.0000 mg | ORAL_TABLET | Freq: Once | ORAL | Status: AC
  Administered 2015-12-01: 10 mg via ORAL
  Filled 2015-12-01: qty 1

## 2015-12-01 MED ORDER — DOCUSATE SODIUM 100 MG PO CAPS: 100.0000 mg | ORAL_CAPSULE | Freq: Two times a day (BID) | ORAL | Status: DC

## 2015-12-01 MED FILL — METOCLOPRAMIDE 10 MG TABLET: 10 | 8 days supply | Qty: 30 | Fill #0

## 2015-12-01 MED FILL — FERROUS SULFATE 325 MG TAB: 325 (65 FE) | 30 days supply | Qty: 60 | Fill #0

## 2015-12-01 NOTE — Discharge Instructions (Signed)
Anemia inespecfica (Anemia, Nonspecific) La anemia es una enfermedad en la que la concentracin de glbulos rojos o el nivel de hemoglobina en la sangre estn por debajo de lo normal. La hemoglobina es la sustancia de los glbulos rojos que lleva el oxgeno a todo el cuerpo. La anemia da como resultado que los tejidos no reciban la cantidad suficiente de oxgeno.  CAUSAS  Las causas ms frecuentes de anemia son:   Elvina Mattes. El sangrado puede ser interno o externo. Incluye sangrado excesivo debido al perodo (en las mujeres) o por los intestinos.   Dficit nutricional.   Enfermedad renal, tiroidea o heptica crnicas.  Enfermedades de la mdula sea que disminuyen la produccin de glbulos rojos.  Cncer y tratamientos para Science writer.  VIH, sida y sus tratamientos.  Trastornos del bazo que aumentan la destruccin de glbulos rojos.  Enfermedades de [employer].  Destruccin excesiva de glbulos rojos debido a una infeccin, a medicamentos y a Nurse, mental health. SIGNOS Y SNTOMAS   Debilidad leve.   Mareos.   Dolor de [place].  Palpitaciones.   Falta de aire, especialmente con el ejercicio.   Palidez.  Sensibilidad al fro.  Indigestin.  Nuseas.  Dificultad para dormir.  Dificultad para concentrarse. Los sntomas pueden ocurrir repentinamente o pueden Psychologist, forensic.  DIAGNSTICO  Con frecuencia es necesario realizar anlisis de [employer]. Estos ayudan al profesional a Adult nurse. Su mdico controlar la materia fecal para Hydrographic surveyor la presencia de [place] y buscar otras causas de prdida de [place].  TRATAMIENTO  El tratamiento vara segn la causa de la anemia. Las opciones de tratamiento son:   Suplementos de hierro, vitamina [id number], o cido flico.   Medicamentos con hormonas.   Transfusin de [place]. Ser necesaria en los casos de prdida de [place] grave.   Hospitalizacin. Ser necesaria si la  prdida de sangre es continua y significativa.   Cambios en la dieta.  Extirpacin del bazo. INSTRUCCIONES PARA EL CUIDADO EN EL HOGAR Cumpla con todas las visitas de control. Generalmente demora varias semanas corregir la anemia, y es muy importante que el mdico controle su enfermedad y su respuesta al [place]. SOLICITE ATENCIN MDICA DE INMEDIATO SI:   Siente debilidad extrema, falta de aire o dolor en el pecho.   Se siente mareado o tiene dificultad para concentrarse.  Tiene una hemorragia vaginal abundante.   Aparece una erupcin cutnea.   La materia fecal es negra, de aspecto alquitranado.   Se desmaya.   Vomita sangre.   Vomita repetidas veces.   Siente dolor abdominal.  Tiene fiebre o sntomas persistentes durante ms de 2 - 3 das.   Tiene fiebre y los sntomas empeoran repentinamente.   Se deshidrata.  ASEGRESE DE QUE:  Comprende estas instrucciones.  Controlar su afeccin.  Recibir ayuda de inmediato si no mejora o si empeora.   Esta informacin no tiene Marine scientist el consejo del mdico. Asegrese de hacerle al mdico cualquier pregunta que tenga.   Document Released: 06/04/2005 Document Revised: 02/04/2013 Elsevier Interactive Patient Education 2016 [place] (Constipation, Adult) Se llama constipacin cuando:  Elimina heces (defeca) menos de 3 veces por semana.  Tiene dificultad para defecar.  Las heces son secas y duras o son ms grandes que lo normal. CUIDADOS EN EL HOGAR   Consuma alimentos con alto contenido de [place]. Por ejemplo, frutas, vegetales, porotos y cereales integrales, como el arroz integral.  Evite los alimentos ricos en grasas y Location manager. Estos incluyen patatas  fritas, hamburguesas, galletas, dulces y refrescos.  Si no consume suficientes alimentos ricos en fibras, tome productos que tengan agregado de fibra (suplementos).  Beba suficiente lquido para mantener el pis  (orina) claro o de color amarillo plido.  Haga ejercicio en forma regular, o como lo indique su mdico.  Vaya al bao cuando sienta la necesidad de Landscape architect. No se aguante las ganas.  Solo tome los medicamentos que le haya indicado su mdico. No tome medicamentos que le ayuden a Landscape architect (laxantes) sin antes consultarlo con su mdico. SOLICITE AYUDA DE INMEDIATO SI:   Observa sangre brillante en las heces (materia fecal).  El estreimiento dura ms de 4 das o [place].  Tiene dolor en el vientre (abdominal) o el trasero (recto).  Las heces son delgadas (como un lpiz).  Pierde peso de [place] inexplicable. ASEGRESE DE QUE:   Comprende estas instrucciones.  Controlar su afeccin.  Recibir ayuda de inmediato si no mejora o si empeora.   Esta informacin no tiene Marine scientist el consejo del mdico. Asegrese de hacerle al mdico cualquier pregunta que tenga.   Document Released: 07/07/2010 Document Revised: 06/25/2014 Elsevier Interactive Patient Education [employer].

## 2015-12-01 NOTE — MAU Provider Note (Signed)
History     CSN: [id number]  Arrival date and time: 12/01/15 [id number]   First Provider Initiated Contact with Patient 12/01/15 1035       Chief Complaint  Patient presents with  . Nausea   HPI Darlene Salazar is a 45 y.o. female who presents 2 days s/p LAVH complaining of n/v. Symptoms began this morning after taking her pain medication. Reports taking percocet & ibuprofen without eating. Soon after she vomited 5 times and felt weak. Denies abdominal pain, fever, diarrhea. Some vaginal spotting. Has not had BM since Monday.   OB History    Gravida Para Term Preterm AB TAB SAB Ectopic Multiple Living   5 3 3  0 2 0 2 0 0 3      Past Medical History  Diagnosis Date  . Menorrhagia   . High cholesterol   . Hypertension     no med, patient denies  . Anemia   . Depression   . Shortness of breath dyspnea     with exertion    Past Surgical History  Procedure Laterality Date  . Cholecystectomy    . Wisdom tooth extraction    . Laparoscopic vaginal hysterectomy with salpingectomy Bilateral 11/29/2015    Procedure: LAPAROSCOPIC ASSISTED VAGINAL HYSTERECTOMY WITH SALPINGECTOMY;  Surgeon: Emily Filbert, MD;  Location: [facility];  Service: Gynecology;  Laterality: Bilateral;    Family History  Problem Relation Age of Onset  . Diabetes Father   . Hyperlipidemia Father   . Hypertension Father   . Cancer Mother 74    uterine  . Diabetes Brother     Social History  Substance Use Topics  . Smoking status: Never Smoker   . Smokeless tobacco: Never Used  . Alcohol Use: No    Allergies: No Known Allergies  Prescriptions prior to admission  Medication Sig Dispense Refill Last Dose  . ferrous sulfate 325 (65 FE) MG tablet Take 1 tablet (325 mg total) by mouth daily with breakfast. (Patient not taking: Reported on 11/15/2015) 30 tablet 1 Not Taking at Unknown time  . ibuprofen (ADVIL,MOTRIN) 800 MG tablet Take 1 tablet (800 mg total) by mouth every 8 (eight) hours as needed (mild  pain). 30 tablet 0   . oxyCODONE-acetaminophen (PERCOCET/ROXICET) 5-325 MG tablet Take 1-2 tablets by mouth every 4 (four) hours as needed for severe pain (moderate to severe pain (when tolerating fluids)). 30 tablet 0     Review of Systems  Constitutional: Negative for fever and chills.  Respiratory: Negative for cough and shortness of breath.   Cardiovascular: Negative for chest pain and palpitations.  Gastrointestinal: Positive for nausea, vomiting and constipation. Negative for heartburn, abdominal pain, diarrhea and blood in stool.  Genitourinary: Negative for dysuria.       + vaginal spotting  Neurological: Positive for weakness and headaches.   Physical Exam   Blood pressure 112/67, pulse 82, temperature 98 F (36.7 C), temperature source Oral, resp. rate 18, height 4' 8.5" (1.435 m), weight 186 lb (84.369 kg), last menstrual period 11/12/2015, SpO2 97 %.  Physical Exam  Nursing note and vitals reviewed. Constitutional: She is oriented to person, place, and time. She appears well-developed and well-nourished. No distress.  HENT:  Head: Normocephalic and atraumatic.  Eyes: Conjunctivae are normal. Right eye exhibits no discharge. Left eye exhibits no discharge. No scleral icterus.  Neck: Normal range of motion.  Cardiovascular: Normal rate, regular rhythm and normal heart sounds.   No murmur heard. Respiratory: Effort normal and breath sounds  normal. No respiratory distress. She has no wheezes.  GI: Soft. Bowel sounds are normal. She exhibits no distension. There is no tenderness. There is no rebound and no guarding.  Neurological: She is alert and oriented to person, place, and time.  Skin: Skin is warm and dry. She is not diaphoretic.  Psychiatric: She has a normal mood and affect. Her behavior is normal. Judgment and thought content normal.    MAU Course  Procedures Results for orders placed or performed during the hospital encounter of 12/01/15 (from the past 24 hour(s))   CBC     Status: Abnormal   Collection Time: 12/01/15 10:44 AM  Result Value Ref Range   WBC 8.9 4.0 - 10.5 K/uL   RBC 3.53 (L) 3.87 - 5.11 MIL/uL   Hemoglobin 7.9 (L) 12.0 - 15.0 g/dL   HCT 25.5 (L) 36.0 - 46.0 %   MCV 72.2 (L) 78.0 - 100.0 fL   MCH 22.4 (L) 26.0 - 34.0 pg   MCHC 31.0 30.0 - 36.0 g/dL   RDW 17.5 (H) 11.5 - 15.5 %   Platelets 376 150 - 400 K/uL  Urinalysis, Routine w reflex microscopic (not at [facility])     Status: Abnormal   Collection Time: 12/01/15 10:45 AM  Result Value Ref Range   Color, Urine STRAW (A) YELLOW   APPearance CLEAR CLEAR   Specific Gravity, Urine <1.005 (L) 1.005 - 1.030   pH 7.0 5.0 - 8.0   Glucose, UA NEGATIVE NEGATIVE mg/dL   Hgb urine dipstick TRACE (A) NEGATIVE   Bilirubin Urine NEGATIVE NEGATIVE   Ketones, ur NEGATIVE NEGATIVE mg/dL   Protein, ur NEGATIVE NEGATIVE mg/dL   Nitrite NEGATIVE NEGATIVE   Leukocytes, UA NEGATIVE NEGATIVE  Urine microscopic-add on     Status: Abnormal   Collection Time: 12/01/15 10:45 AM  Result Value Ref Range   Squamous Epithelial / LPF 0-5 (A) NONE SEEN   WBC, UA 0-5 0 - 5 WBC/hpf   RBC / HPF 0-5 0 - 5 RBC/hpf   Bacteria, UA RARE (A) NONE SEEN    MDM CBC - continued anemia, has not started iron Exam normal Reglan 10 mg po -- pt reports improvement in symptoms Pt was initially tachypniec -- resolved after receiving medication for nausea Pt has post op f/u scheduled with Dr. Hulan Fray  Assessment and Plan  A: 1. Post-operative nausea and vomiting   2. Postoperative anemia due to acute blood loss    P: Discharge home Rx reglan, ferrous sulfate, & docusate sodium Discussed eating something before taking pain medication Keep f/u with Dr. Barrington Ellison 12/01/2015, 10:35 AM

## 2015-12-01 NOTE — MAU Note (Addendum)
Pt had hysterectomy two days ago. This morning she has been vomiting. Pt also states her blood pressure it "too high". Pt c/o feeling shortness of breath starting at 7am. Pt denies pain.

## 2016-01-11 ENCOUNTER — Encounter: Payer: Self-pay | Admitting: Obstetrics & Gynecology

## 2016-01-11 ENCOUNTER — Ambulatory Visit (INDEPENDENT_AMBULATORY_CARE_PROVIDER_SITE_OTHER): Payer: Self-pay | Admitting: Obstetrics & Gynecology

## 2016-01-11 VITALS — BP 123/77 | HR 82 | Ht <= 58 in | Wt 183.7 lb

## 2016-01-11 DIAGNOSIS — R002 Palpitations: Secondary | ICD-10-CM

## 2016-01-11 DIAGNOSIS — Z9889 Other specified postprocedural states: Secondary | ICD-10-CM

## 2016-01-11 DIAGNOSIS — R61 Generalized hyperhidrosis: Secondary | ICD-10-CM

## 2016-01-11 LAB — FOLLICLE STIMULATING HORMONE: FSH: 30.3 m[IU]/mL

## 2016-01-11 NOTE — Progress Notes (Signed)
   Subjective:    Patient ID: Darlene Salazar, female    DOB: 1970-10-05, 45 y.o.   MRN: [id number]  HPI  45 yo MH lady here 6 weeks after a LAVH/BS for menorrhagia, fibroids, and chronic pelvic pain. She had sex this week with no problems or pain. She reports normal bowel and bladder function.  Her problems since surgery are hot flashes, heart palpitations, and left arm/hand paresthesia.  Review of Systems Her pathology was benign.    Objective:   Physical Exam  Obese HFNAD Breathing, conversing (with video interpretor), and ambulating normally Heart- rrr Lungs- CTAB Abd- benign Incisions- healed well Vaginal cuff with a 5 mm area of granulation tissue. I treated this with silver nitrate.       Assessment & Plan:  Hot flashes- Her ovaries were left in situ. I will check a [place]. Heart palpitations/left arm paresthesias- refer to cardiology RTC 2 weeks

## 2016-01-31 ENCOUNTER — Encounter: Payer: Self-pay | Admitting: Cardiology

## 2016-02-14 ENCOUNTER — Ambulatory Visit (INDEPENDENT_AMBULATORY_CARE_PROVIDER_SITE_OTHER): Payer: No Typology Code available for payment source | Admitting: Cardiology

## 2016-02-14 ENCOUNTER — Encounter (INDEPENDENT_AMBULATORY_CARE_PROVIDER_SITE_OTHER): Payer: Self-pay

## 2016-02-14 VITALS — BP 118/70 | HR 69 | Ht <= 58 in | Wt 187.8 lb

## 2016-02-14 DIAGNOSIS — R0602 Shortness of breath: Secondary | ICD-10-CM

## 2016-02-14 DIAGNOSIS — R002 Palpitations: Secondary | ICD-10-CM | POA: Insufficient documentation

## 2016-02-14 DIAGNOSIS — R0789 Other chest pain: Secondary | ICD-10-CM

## 2016-02-14 NOTE — Patient Instructions (Signed)
Medication Instructions:  The current medical regimen is effective;  continue present plan and medications.  Follow-Up: Follow up as needed with Dr Skains.  Thank you for choosing [facility]!!     

## 2016-02-14 NOTE — Progress Notes (Signed)
Cardiology Office Note    Date:  02/14/2016   ID:  Darlene Salazar, DOB 08-12-1970, MRN [id number]  PCP:  Lance Bosch, NP  Cardiologist:   Candee Furbish, MD     History of Present Illness:  Darlene Salazar is a 45 y.o. female here for the evaluation of palpitations and left arm paresthesias at the request of Dr. Clovia Cuff of OB-GYN.  Had LAVH for chronic pelvic pain, fibroids, bleeding. Since surgery has hot flashes and palpitations.   Feel like a needle pinching heart. Surgery 11/29/15. Felt like heart stopped suddenly. Was short of breath before surgery as well. Like heart wants to come out. Not very frequent. No specific time of day.  No DM, no tob, no fam hx of CAD. Mild back pain.   Given her hot flashes, FSH was drawn by Dr. Hulan Fray. 30.  Back in 2013 she had atypical chest pain, stress echocardiogram which was reassuring, normal EF.   Past Medical History:  Diagnosis Date  . Anemia   . Depression   . High cholesterol   . Hypertension    no med, patient denies  . Menorrhagia   . Shortness of breath dyspnea    with exertion    Past Surgical History:  Procedure Laterality Date  . CHOLECYSTECTOMY    . LAPAROSCOPIC VAGINAL HYSTERECTOMY WITH SALPINGECTOMY Bilateral 11/29/2015   Procedure: LAPAROSCOPIC ASSISTED VAGINAL HYSTERECTOMY WITH SALPINGECTOMY;  Surgeon: Emily Filbert, MD;  Location: [facility];  Service: Gynecology;  Laterality: Bilateral;  . WISDOM TOOTH EXTRACTION      Current Medications: Outpatient Medications Prior to Visit  Medication Sig Dispense Refill  . docusate sodium (COLACE) 100 MG capsule Take 1 capsule (100 mg total) by mouth every 12 (twelve) hours. (Patient not taking: Reported on 01/11/2016) 20 capsule 0  . ferrous sulfate 325 (65 FE) MG tablet Take 1 tablet (325 mg total) by mouth 2 (two) times daily with a meal. (Patient not taking: Reported on 01/11/2016) 60 tablet 0  . ibuprofen (ADVIL,MOTRIN) 800 MG tablet Take 1 tablet (800 mg total)  by mouth every 8 (eight) hours as needed (mild pain). (Patient not taking: Reported on 01/11/2016) 30 tablet 0  . metoCLOPramide (REGLAN) 10 MG tablet Take 1 tablet (10 mg total) by mouth every 6 (six) hours. (Patient not taking: Reported on 01/11/2016) 30 tablet 0  . oxyCODONE-acetaminophen (PERCOCET/ROXICET) 5-325 MG tablet Take 1-2 tablets by mouth every 4 (four) hours as needed for severe pain (moderate to severe pain (when tolerating fluids)). (Patient not taking: Reported on 01/11/2016) 30 tablet 0   No facility-administered medications prior to visit.      Allergies:   Review of patient's allergies indicates no known allergies.   Social History   Social History  . Marital status: Married    Spouse name: N/A  . Number of children: N/A  . Years of education: N/A   Social History Main Topics  . Smoking status: Never Smoker  . Smokeless tobacco: Never Used  . Alcohol use No  . Drug use: No  . Sexual activity: Yes    Birth control/ protection: None   Other Topics Concern  . Not on file   Social History Narrative   ** Merged History Encounter **         Family History:  The patient's family history includes Cancer (age of onset: 28) in her mother; Diabetes in her brother and father; Hyperlipidemia in her father; Hypertension in her father.   ROS:  Please see the history of present illness.    ROS All other systems reviewed and are negative.   PHYSICAL EXAM:   VS:  BP 118/70   Pulse 69   Ht 4\' 9"  (1.448 m)   Wt 187 lb 12.8 oz (85.2 kg)   LMP 11/12/2015 (Exact Date)   BMI 40.64 kg/m    GEN: Well nourished, well developed, in no acute distress  HEENT: normal  Neck: no JVD, carotid bruits, or masses Cardiac: RRR; no murmurs, rubs, or gallops,no edema  Respiratory:  clear to auscultation bilaterally, normal work of breathing GI: soft, nontender, nondistended, + BS, overweight MS: no deformity or atrophy  Skin: warm and dry, no rash Neuro:  Alert and Oriented x 3,  Strength and sensation are intact Psych: euthymic mood, full affect  Wt Readings from Last 3 Encounters:  02/14/16 187 lb 12.8 oz (85.2 kg)  01/11/16 183 lb 11.2 oz (83.3 kg)  12/01/15 186 lb (84.4 kg)      Studies/Labs Reviewed:   EKG:  EKG is ordered today.  The ekg ordered today demonstrates 02/14/16-sinus rhythm, 69, no other abnormalities.  Recent Labs: 08/21/2015: ALT 21; BUN 11; Creatinine, Ser 0.72; Potassium 3.9; Sodium 135 12/01/2015: Hemoglobin 7.9; Platelets 376   Lipid Panel    Component Value Date/Time   CHOL 243 (H) 08/30/2014 1225   TRIG 194 (H) 08/30/2014 1225   HDL 36 (L) 08/30/2014 1225   CHOLHDL 6.8 08/30/2014 1225   VLDL 39 08/30/2014 1225   LDLCALC 168 (H) 08/30/2014 1225    Additional studies/ records that were reviewed today include:   Prior office notes, lab work reviewed [facility] which states it is in the postmenopausal range. Prior stress echocardiogram in 2013 was reassuring, no ischemia, normal EF. Prior hemoglobin in the upper 7 range prior to surgery.    ASSESSMENT:    No diagnosis found.   PLAN:  In order of problems listed above:  Palpitations  - She is describing PVCs or PACs. Compensatory pause after PVC described which causes increased filling of the heart and a strong "thud" like beat afterwards. Many people describe this as they're heart beating out of her chest at times. Reassurance has been given. These are benign. Her EKG today is also benign. If her palpitations worsen week always place a heart monitor however at this time we will forego. Palpitations can also be seen in the menopausal phase with associated hot flashes. We also asked her to avoid caffeine, Sudafed. Expressed the importance of good sleep hygiene and daily exercise.  Atypical chest pain  - Needle like chest pain, musculoskeletal. Noncardiac. Reassurance. Prior stress test reassuring.  Shortness of breath  - Has been improving. Likely associate with prior anemia.  Hemoccult and was in the 7 range. Continue to monitor. Continue to encourage weight loss, exercise.  Obesity, morbid  - Encourage weight loss.     Medication Adjustments/Labs and Tests Ordered: Current medicines are reviewed at length with the patient today.  Concerns regarding medicines are outlined above.  Medication changes, Labs and Tests ordered today are listed in the Patient Instructions below. There are no Patient Instructions on file for this visit.   Signed, Candee Furbish, MD  02/14/2016 9:26 AM    [facility] [place], [place], [place]  [address] Phone: [phone]; Fax: [phone]

## 2016-06-19 ENCOUNTER — Ambulatory Visit: Payer: Self-pay | Attending: Family Medicine | Admitting: Internal Medicine

## 2016-06-19 ENCOUNTER — Ambulatory Visit: Payer: No Typology Code available for payment source | Admitting: Family Medicine

## 2016-06-19 ENCOUNTER — Encounter: Payer: Self-pay | Admitting: Internal Medicine

## 2016-06-19 ENCOUNTER — Ambulatory Visit: Payer: No Typology Code available for payment source | Admitting: Internal Medicine

## 2016-06-19 DIAGNOSIS — R51 Headache: Secondary | ICD-10-CM | POA: Insufficient documentation

## 2016-06-19 DIAGNOSIS — Z8249 Family history of ischemic heart disease and other diseases of the circulatory system: Secondary | ICD-10-CM | POA: Insufficient documentation

## 2016-06-19 DIAGNOSIS — E7800 Pure hypercholesterolemia, unspecified: Secondary | ICD-10-CM | POA: Insufficient documentation

## 2016-06-19 DIAGNOSIS — H04203 Unspecified epiphora, bilateral lacrimal glands: Secondary | ICD-10-CM

## 2016-06-19 DIAGNOSIS — Z131 Encounter for screening for diabetes mellitus: Secondary | ICD-10-CM

## 2016-06-19 DIAGNOSIS — D649 Anemia, unspecified: Secondary | ICD-10-CM

## 2016-06-19 DIAGNOSIS — H538 Other visual disturbances: Secondary | ICD-10-CM

## 2016-06-19 DIAGNOSIS — Z90710 Acquired absence of both cervix and uterus: Secondary | ICD-10-CM | POA: Insufficient documentation

## 2016-06-19 DIAGNOSIS — Z6841 Body Mass Index (BMI) 40.0 and over, adult: Secondary | ICD-10-CM | POA: Insufficient documentation

## 2016-06-19 DIAGNOSIS — Z833 Family history of diabetes mellitus: Secondary | ICD-10-CM | POA: Insufficient documentation

## 2016-06-19 DIAGNOSIS — H6122 Impacted cerumen, left ear: Secondary | ICD-10-CM

## 2016-06-19 DIAGNOSIS — Z23 Encounter for immunization: Secondary | ICD-10-CM

## 2016-06-19 LAB — CBC WITH DIFFERENTIAL/PLATELET
BASOS PCT: 0 %
Basophils Absolute: 0 cells/uL (ref 0–200)
EOS PCT: 3 %
Eosinophils Absolute: 324 cells/uL (ref 15–500)
HCT: 38.9 % (ref 35.0–45.0)
Hemoglobin: 12.7 g/dL (ref 11.7–15.5)
LYMPHS PCT: 29 %
Lymphs Abs: 3132 cells/uL (ref 850–3900)
MCH: 25.3 pg — ABNORMAL LOW (ref 27.0–33.0)
MCHC: 32.6 g/dL (ref 32.0–36.0)
MCV: 77.6 fL — AB (ref 80.0–100.0)
MPV: 9.5 fL (ref 7.5–12.5)
Monocytes Absolute: 540 cells/uL (ref 200–950)
Monocytes Relative: 5 %
Neutro Abs: 6804 cells/uL (ref 1500–7800)
Neutrophils Relative %: 63 %
PLATELETS: 379 10*3/uL (ref 140–400)
RBC: 5.01 MIL/uL (ref 3.80–5.10)
RDW: 16.2 % — AB (ref 11.0–15.0)
WBC: 10.8 10*3/uL (ref 3.8–10.8)

## 2016-06-19 LAB — POCT GLYCOSYLATED HEMOGLOBIN (HGB A1C): Hemoglobin A1C: 5.8

## 2016-06-19 MED ORDER — LORATADINE 10 MG PO TABS: 10.0000 mg | ORAL_TABLET | Freq: Every day | ORAL | 11 refills | Status: DC

## 2016-06-19 NOTE — Patient Instructions (Signed)
La diabetes mellitus y los alimentos (Diabetes Mellitus and Food) Es importante que controle su nivel de azcar en la sangre (glucosa). El nivel de glucosa en sangre depende en gran medida de lo que usted come. Comer alimentos saludables en las cantidades [place] a lo largo del Training and development officer, aproximadamente a la misma hora [employer], lo ayudar a Chief Technology Officer su nivel de Multimedia programmer. Tambin puede ayudarlo a retrasar o Patent attorney de la diabetes mellitus. Comer de [employer] saludable incluso puede ayudarlo a Chartered loss adjuster de presin arterial y a Science writer o Theatre manager un peso saludable. Entre las recomendaciones generales para alimentarse y Audiological scientist los alimentos de forma saludable, se incluyen las siguientes:  Respetar las comidas principales y comer colaciones con regularidad. Evitar pasar largos perodos sin comer con el fin de perder peso.  Seguir una dieta que consista principalmente en alimentos de origen vegetal, como frutas, vegetales, frutos secos, legumbres y cereales integrales.  Utilizar mtodos de coccin a baja temperatura, como hornear, en lugar de mtodos de coccin a alta temperatura, como frer en abundante aceite. Trabaje con el nutricionista para aprender a Financial planner nutricional de las etiquetas de los alimentos. CMO PUEDEN AFECTARME LOS ALIMENTOS? Carbohidratos  Los carbohidratos afectan el nivel de glucosa en sangre ms que cualquier otro tipo de alimento. El nutricionista lo ayudar a Teacher, adult education cuntos carbohidratos puede consumir en cada comida y ensearle a contarlos. El recuento de carbohidratos es importante para mantener la glucosa en sangre en un nivel saludable, en especial si utiliza insulina o toma determinados medicamentos para la diabetes mellitus. Alcohol  El alcohol puede provocar disminuciones sbitas de la glucosa en sangre (hipoglucemia), en especial si utiliza insulina o toma determinados medicamentos para la diabetes mellitus. La  hipoglucemia es una afeccin que puede poner en peligro la vida. Los sntomas de la hipoglucemia (somnolencia, mareos y Data processing manager) son similares a los sntomas de haber consumido mucho alcohol. Si el mdico lo autoriza a beber alcohol, hgalo con moderacin y siga estas pautas:  Las mujeres no deben beber ms de un trago por da, y los hombres no deben beber ms de dos tragos por Training and development officer. Un trago es igual a:  12 onzas (355 ml) de cerveza  5 onzas de vino (150 ml) de vino  1,5onzas (27m) de bebidas espirituosas  No beba con el estmago vaco.  Mantngase hidratado. Beba agua, gaseosas dietticas o t helado sin azcar.  Las gaseosas comunes, los jugos y otros refrescos podran contener muchos carbohidratos y se dCivil Service fast streamer QU ALIMENTOS NO SE RECOMIENDAN? Cuando haga las elecciones de alimentos, es importante que recuerde que todos los alimentos son distintos. Algunos tienen menos nutrientes que otros por porcin, aunque podran tener la misma cantidad de caloras o carbohidratos. Es difcil darle al cuerpo lo que necesita cuando consume alimentos con menos nutrientes. Estos son algunos ejemplos de alimentos que debera evitar ya que contienen muchas caloras y carbohidratos, pero pocos nutrientes:  GPhysicist, medicaltrans (la mayora de los alimentos procesados incluyen grasas trans en la etiqueta de Informacin nutricional).  Gaseosas comunes.  Jugos.  Caramelos.  Dulces, como tortas, pasteles, rosquillas y gHuxley  Comidas fritas. QU ALIMENTOS PUEDO COMER? Consuma alimentos ricos en nutrientes, que nutrirn el cuerpo y lo mantendrn saludable. Los alimentos que debe comer tambin dependern de varios factores, como:  Las caloras que necesita.  Los medicamentos que toma.  Su peso.  El nivel de glucosa en s[place]ating[place]  El nJacksonde presin arterial.  El nivel de colesterol.  Debe consumir una amplia variedad de alimentos, por ejemplo:  Protenas.  Cortes de [employer].  Protenas con bajo contenido de grasas saturadas, como pescado, clara de huevo y frijoles. Evite las carnes procesadas.  Frutas y vegetales.  Frutas y Photographer que pueden ayudar a Chief Technology Officer los niveles sanguneos de [place], como [place], mangos y batatas.  Productos lcteos.  Elija productos lcteos sin grasa o con bajo contenido de [place], como [place], yogur y [place].  Cereales, panes, pastas y arroz.  Elija cereales integrales, como panes multicereales, avena en grano y arroz integral. Estos alimentos pueden ayudar a controlar la presin arterial.  Daphene Jaeger.  Alimentos que contengan grasas saludables, como frutos secos, Musician, aceite de [place], aceite de canola y pescado. TODOS LOS QUE PADECEN DIABETES MELLITUS TIENEN EL [place] PLAN DE [place]? Dado que todas las personas que padecen diabetes mellitus son distintas, no hay un solo plan de comidas que funcione para todos. Es muy importante que se rena con un nutricionista que lo ayudar a crear un plan de comidas adecuado para usted. Esta informacin no tiene Marine scientist el consejo del mdico. Asegrese de hacerle al mdico cualquier pregunta que tenga. Document Released: 09/11/2007 Document Revised: 06/25/2014 Document Reviewed: 05/01/2013 Elsevier Interactive Patient Education  2017 [place] para comer fuera de su casa si tiene diabetes (Tips for Eating Away From Home If You Have Diabetes) El control del nivel de glucemia, que tambin se conoce como azcar en la [place], puede ser un reto, que se complica an ms cuando uno no prepara sus propias comidas. Los siguientes consejos pueden ayudarlo a Chief Technology Officer la diabetes cuando come fuera de su casa. PLANIFICACIN Organcese si sabe que comer fuera de su casa:  Pregntele al mdico cmo sincronizar las comidas y el medicamento si est en tratamiento con insulina.  Haga una lista de restaurantes cercanos que ofrezcan opciones saludables. Si  tienen un men que pueda leer en su casa, llvelo y planifique lo que pedir con anticipacin.  Busque informacin en lnea del restaurante donde quiera comer. Muchos restaurantes de comida rpida y cadenas de restaurantes incluyen la informacin nutricional en lnea. Tenga en cuenta esta informacin para elegir las opciones ms saludables y calcular los carbohidratos de la comida.  Use un libro de recuento de carbohidratos o una aplicacin mvil para fijarse en el contenido de carbohidratos y el tamao de porcin de lo que desea comer.  Comience a Corporate treasurer de las porciones y a Marine scientist cuntas porciones hay en una unidad. Esto le permitir calcular la cantidad de carbohidratos que puede comer. ALIMENTOS LIBRES Un "alimento libre" es cualquier alimento o bebida que contenga menos de 5g de carbohidratos por porcin. Entre los alimentos libres, se incluyen los siguientes:  Muchos vegetales.  Huevos duros.  Nueces o semillas.  Aceitunas.  Quesos.  Carnes. Estos tipos de alimentos son buenas opciones de bocadillos y en general estn disponibles en los bufs de ensaladas. Como aderezos "libres" para [place], puede usarse jugo de limn, vinagre o un aderezo de bajas caloras (con menos de 20caloras por porcin). OPCIONES PARA REDUCIR LOS CARBOHIDRATOS  Reemplace el yogur descremado endulzado por el yogur sin azcar. Tambin puede consumir yogur a base de [place], pero es conveniente una opcin sin azcar o natural, porque tiene menos contenido de carbohidratos.  Pdale al mozo que retire la canasta de pan o las papas de la mesa.  Pida frutas frescas. El buf de ensaladas a menudo ofrece frutas  frescas. Evite las frutas enlatadas, ya que por lo general tienen azcar o almbar.  Pida una ensalada y cmala sin aderezo. Tambin puede crear un aderezo "libre" para ensaladas.  Pida que le [employer] alimentos. Por ejemplo, en lugar de papas fritas, pida una porcin de  vegetales, como una ensalada, judas verdes o brcoli. OTROS CONSEJOS  Si [place] insulina, adminstrela una vez que la comida llegue a la mesa, as las Hotel manager.  Pregntele al mozo sobre el tamao de la porcin antes de pedir la comida y, si la porcin es ms grande de lo que usted debe consumir, pida una caja para llevarse la comida a su casa. Cuando llegue la comida, deje en el plato la cantidad que debe comer y coloque el resto en la caja para llevar.  Considere la posibilidad de Publishing rights manager un plato principal con alguien y de pedir una ensalada como guarnicin. Esta informacin no tiene Marine scientist el consejo del mdico. Asegrese de hacerle al mdico cualquier pregunta que tenga. Document Released: 06/04/2005 Document Revised: 09/26/2015 Document Reviewed: 09/01/2013 Elsevier Interactive Patient Education  2017 [employer].   -

## 2016-06-19 NOTE — Progress Notes (Signed)
Darlene Salazar, is a 46 y.o. female  [id number]  [id number]  DOB - May 03, 1971  CC:  Chief Complaint  Patient presents with  . Establish Care  . Referral       HPI: Darlene Salazar is a 46 y.o. female here today to establish medical care.  Last seen in clinic 3/17 w/ NP, w/ hx of LAVH/BS for menorrhagia in December 01, 2015.  Of note, no c/o post surgery.  She states she has felt this "bump" inside or behind her Left eye for 3-4 months ago, sometimes w/ pinprick sensation in her left eye as well. Both her eyes water when she is reading something for long periods or reading a sign. Where her glasses for long periods of time causes her to have headache and her eyes hurt.  Of note, she had not seen optometry for 3-4 years prior to about 3 wks ago when she saw optometry for new glasses.  She said they looked in her eyes and recd new glasses, which she has not picked up yet.   Patient has No headache, No chest pain, No abdominal pain - No Nausea, No new weakness tingling or numbness, No Cough - SOB.  Interpreter was used to communicate directly with patient for the entire encounter including providing detailed patient instructions.   Review of Systems: Per hpi, o/w all systems reviewed and negative.    No Known Allergies Past Medical History:  Diagnosis Date  . Anemia   . Depression   . High cholesterol   . Hypertension    no med, patient denies  . Menorrhagia   . Shortness of breath dyspnea    with exertion   Current Outpatient Prescriptions on File Prior to Visit  Medication Sig Dispense Refill  . ibuprofen (ADVIL,MOTRIN) 800 MG tablet Take 800 mg by mouth every 8 (eight) hours as needed.     No current facility-administered medications on file prior to visit.    Family History  Problem Relation Age of Onset  . Diabetes Father   . Hyperlipidemia Father   . Hypertension Father   . Cancer Mother 67    uterine  . Diabetes Brother    Social History    Social History  . Marital status: Married    Spouse name: N/A  . Number of children: N/A  . Years of education: N/A   Occupational History  . Not on file.   Social History Main Topics  . Smoking status: Never Smoker  . Smokeless tobacco: Never Used  . Alcohol use No  . Drug use: No  . Sexual activity: Yes    Birth control/ protection: None   Other Topics Concern  . Not on file   Social History Narrative   ** Merged History Encounter **        Objective:   Vitals:   06/19/16 1602  BP: 129/84  Pulse: 79  Resp: 16  Temp: 97.9 F (36.6 C)    Filed Weights   06/19/16 1602  Weight: 197 lb 3.2 oz (89.4 kg)    BP Readings from Last 3 Encounters:  06/19/16 129/84  02/14/16 118/70  01/11/16 123/77    Physical Exam: Constitutional: Patient appears well-developed and well-nourished. No distress. AAOx3, morbid obese, short statured. HENT: Normocephalic, atraumatic, External right and left ear normal. Oropharynx is clear and moist.  ceruminosis impaction right ear, mild ceruminosis on left ear Eyes: Conjunctivae and EOM are normal. PERRL, no scleral icterus. Neck: Normal ROM. Neck supple. No JVD. Marland Kitchen  CVS: RRR, S1/S2 +, no murmurs, no gallops, no carotid bruit.  Pulmonary: Effort and breath sounds normal, no stridor, rhonchi, wheezes, rales.  Abdominal: Soft. BS +,  Obese, no distension, tenderness, rebound or guarding.  Musculoskeletal: Normal range of motion. No edema and no tenderness.  LE: bilat/ no c/c/e, pulses 2+ bilateral. Neuro: Alert.  muscle tone coordination wnl. No cranial nerve deficit grossly. Skin: Skin is warm and dry. No rash noted. Not diaphoretic. No erythema. No pallor. Psychiatric: Normal mood and affect. Behavior, judgment, thought content normal.  Lab Results  Component Value Date   WBC 8.9 12/01/2015   HGB 7.9 (L) 12/01/2015   HCT 25.5 (L) 12/01/2015   MCV 72.2 (L) 12/01/2015   PLT 376 12/01/2015   Lab Results  Component Value Date    CREATININE 0.72 08/21/2015   BUN 11 08/21/2015   NA 135 08/21/2015   K 3.9 08/21/2015   CL 102 08/21/2015   CO2 25 08/21/2015    Lab Results  Component Value Date   HGBA1C 6.0 (H) 08/30/2014   Lipid Panel     Component Value Date/Time   CHOL 243 (H) 08/30/2014 1225   TRIG 194 (H) 08/30/2014 1225   HDL 36 (L) 08/30/2014 1225   CHOLHDL 6.8 08/30/2014 1225   VLDL 39 08/30/2014 1225   LDLCALC 168 (H) 08/30/2014 1225        Depression screen PHQ 2/9 06/19/2016  Decreased Interest 2  Down, Depressed, Hopeless 0  PHQ - 2 Score 2  Altered sleeping 2  Tired, decreased energy 2  Change in appetite 0  Feeling bad or failure about yourself  0  Trouble concentrating 2  Moving slowly or fidgety/restless 0  Suicidal thoughts 0  PHQ-9 Score 8    Assessment and plan:   1. Blurry vision, bilateral, w/ associated headaches w/ current glass prescriptions and intermittent watery eyes. - suspecct due to needed new rx glasses, recd she picks up the new glasses prescription once they are ready and use them - optometry exam about 3 wks ago per pt, w/ new rx provided. - possible allergy component as well, and could trial Claritin qd - Ambulatory referral to Ophthalmology  - not sure if [place] card/cone discount would even cover this visit, reassured her that picking up new rx may be the answer.  2. Ceruminosis, left Sp bilat irrigation w/ good results.  3. Morbid obesity ([place]) w / hx of predm on labs 3/16 - will rechk a1c today, info on low carb diet/exercise provided - may benefit from metformin pending a1c  4. Watery eyes See #1, trial claritin, pick up her new rx glasses  5. Anemia, unspecified type Ho, hx of  LAVH/BS for menorrhagia , will rechk labs, asx currently. - CBC with Differential - BASIC METABOLIC PANEL WITH GFR  6. Diabetes mellitus screening - HgB A1c   Return in about 3 months (around 09/17/2016).  The patient was given clear instructions to go to ER or return  to medical center if symptoms don't improve, worsen or new problems develop. The patient verbalized understanding. The patient was told to call to get lab results if they haven't heard anything in the next week.    This note has been created with Surveyor, quantity. Any transcriptional errors are unintentional.   Maren Reamer, MD, [facility], [place]   06/19/2016, 5:13 PM

## 2016-06-20 ENCOUNTER — Other Ambulatory Visit: Payer: Self-pay | Admitting: Internal Medicine

## 2016-06-20 LAB — BASIC METABOLIC PANEL WITH GFR
BUN: 11 mg/dL (ref 7–25)
CO2: 22 mmol/L (ref 20–31)
Calcium: 8.7 mg/dL (ref 8.6–10.2)
Chloride: 105 mmol/L (ref 98–110)
Creat: 0.78 mg/dL (ref 0.50–1.10)
Glucose, Bld: 89 mg/dL (ref 65–99)
POTASSIUM: 4.2 mmol/L (ref 3.5–5.3)
SODIUM: 138 mmol/L (ref 135–146)

## 2016-06-20 IMAGING — US US PELVIS COMPLETE
1 series · 15 of 25 positions shown · non-contrast
Comparison: 06/03/2013

CLINICAL DATA: Dysfunctional uterine bleeding for several months.

EXAM:
TRANSABDOMINAL AND TRANSVAGINAL ULTRASOUND OF PELVIS
TECHNIQUE: Both transabdominal and transvaginal ultrasound examinations of the
pelvis were performed. Transabdominal technique was performed for
global imaging of the pelvis including uterus, ovaries, adnexal
regions, and pelvic cul-de-sac. It was necessary to proceed with
endovaginal exam following the transabdominal exam to visualize the
uterus, endometrium and ovaries..

[Series 1: us pelvis complete · 81 acquisitions, 15 frames shown]
[im 1/81]
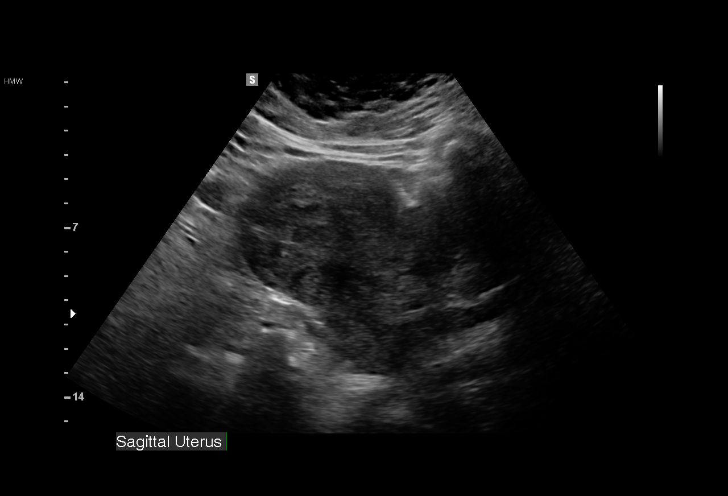
[im 7/81]
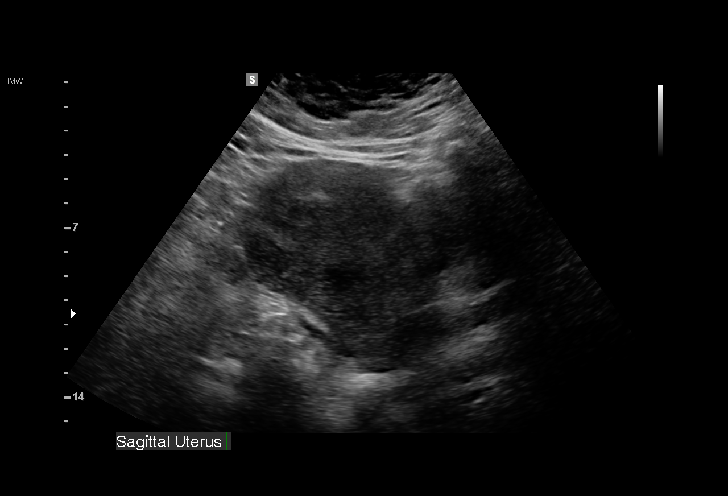
[im 14/81]
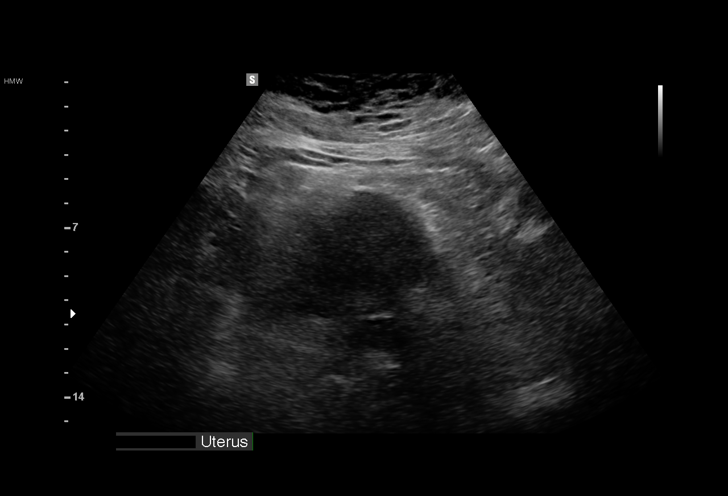
[im 17/81]
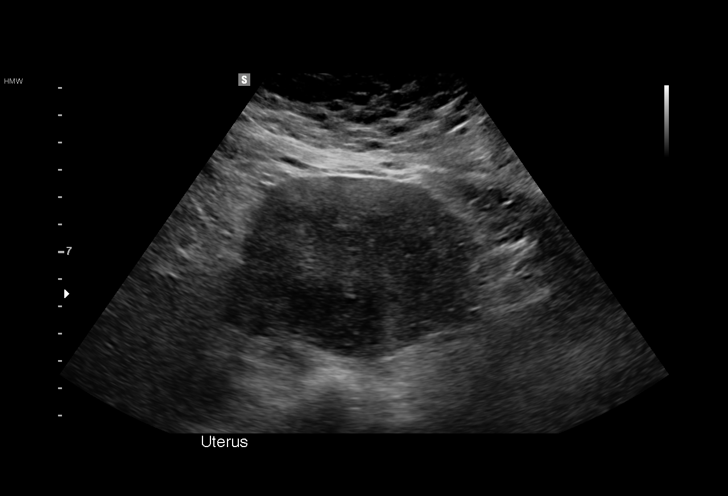
[im 24/81]
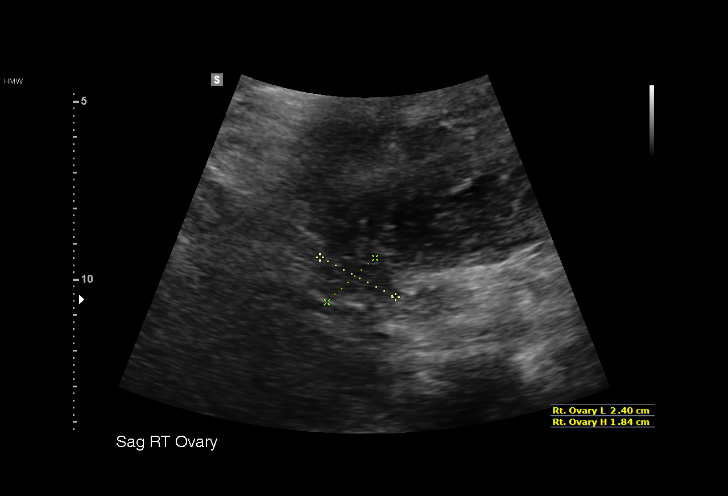
[im 31/81]
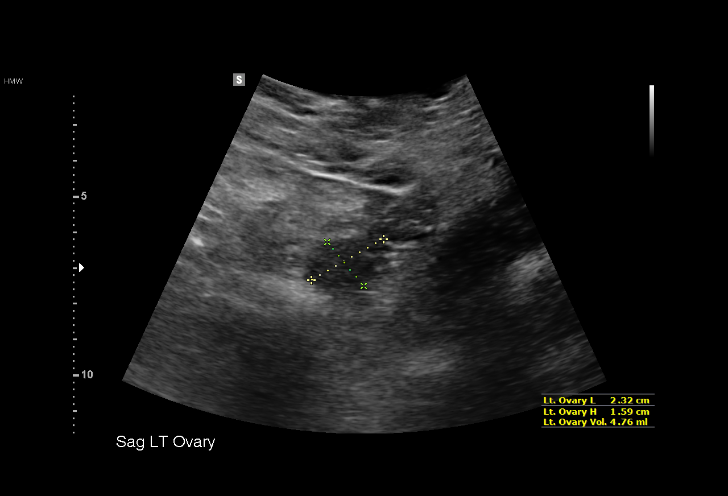
[im 34/81]
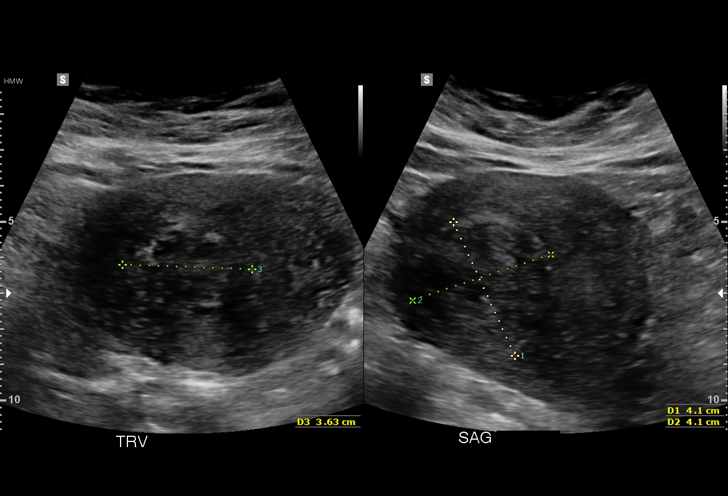
[im 41/81]
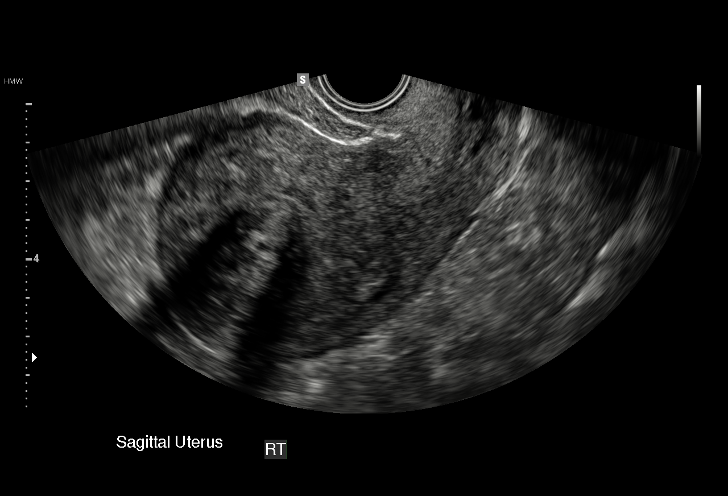
[im 47/81]
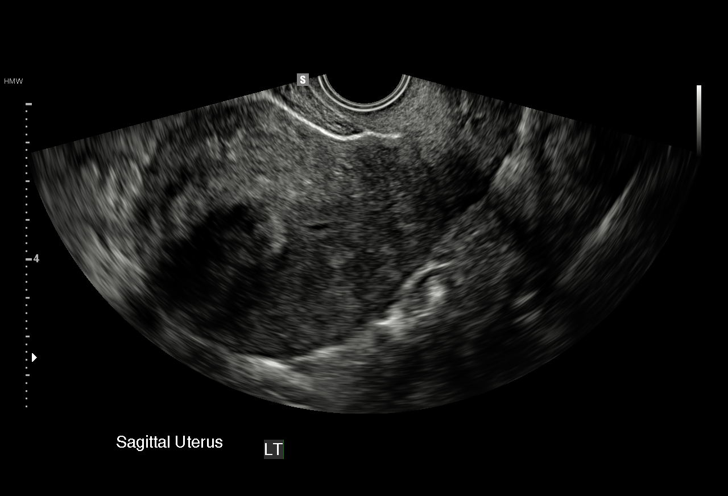
[im 51/81]
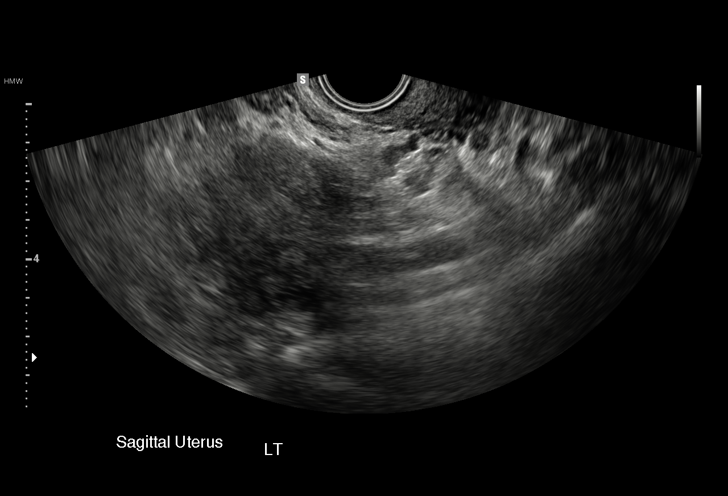
[im 57/81]
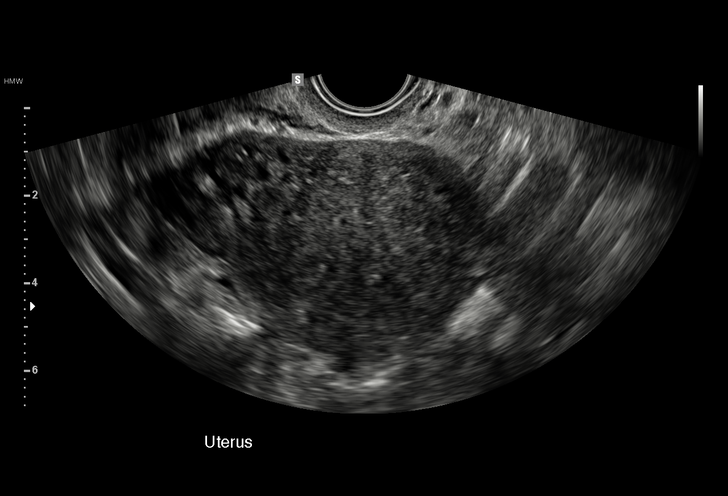
[im 64/81]
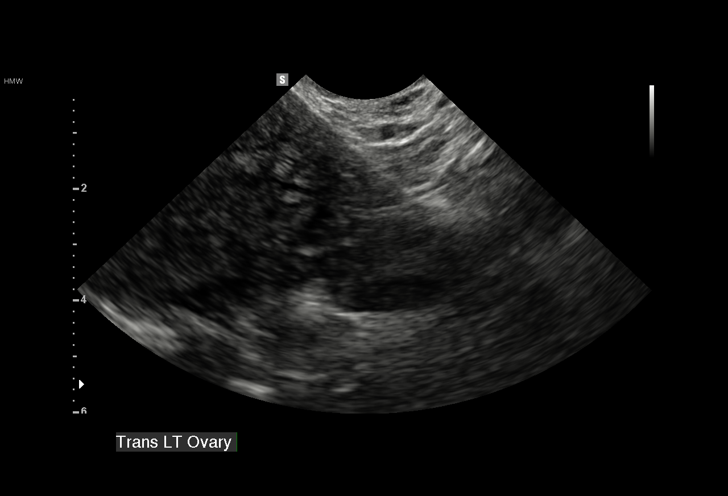
[im 67/81]
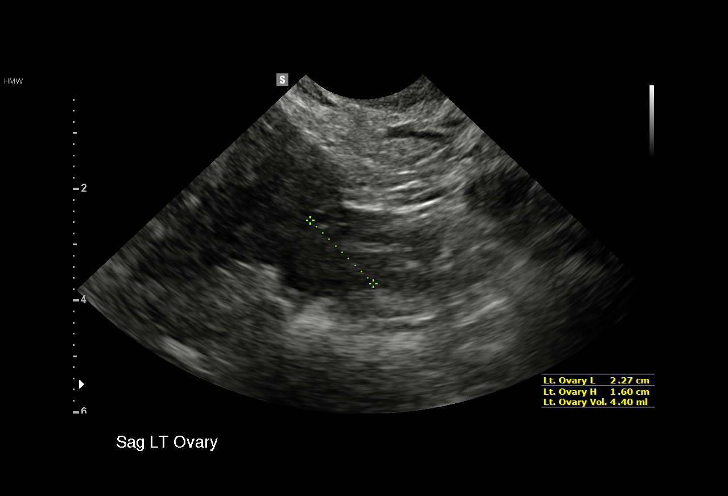
[im 74/81]
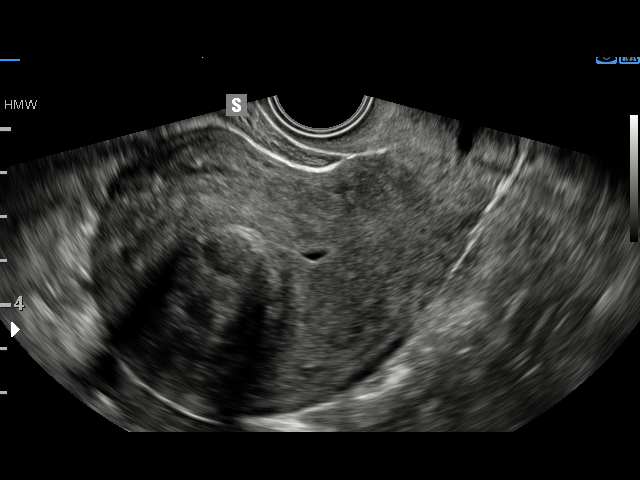
[im 81/81]
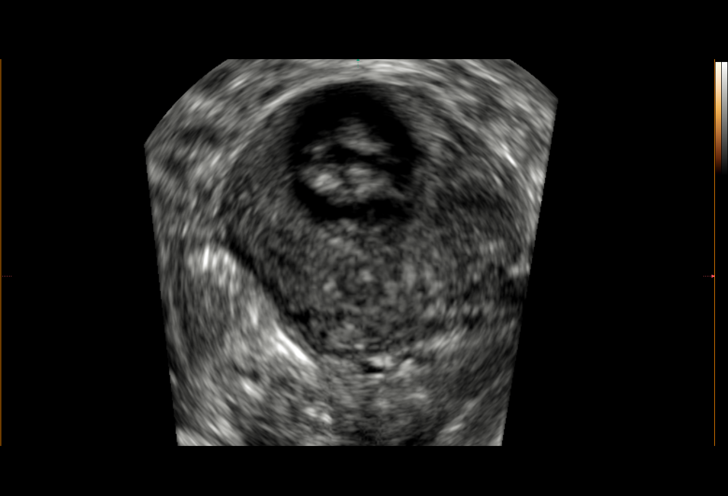

[15 of 25 positions shown; findings below may reference images not displayed]

FINDINGS: Uterus

Measurements: 10.4 x 6.9 x 7.7 cm. Fibroid within the distal uterus
is sub mucosal measuring 4 x 3.9 x 3.2 cm.

Endometrium: Thickness: Difficult to measure due to presence of
fibroid.

Right ovary

Measurements: 2.4 x 1.8 x 2.1 cm. Normal appearance/no adnexal mass.

Left ovary

Measurements: 2.3 x 1.6 x 2.3 cm. Normal appearance/no adnexal mass.

Other findings

No abnormal free fluid.
IMPRESSION: 1. There is a sub mucosal fibroid within the distal uterus. Consider
further evaluation with sonohysterogram for confirmation prior to
hysteroscopy. Endometrial sampling should also be considered if
patient is at high risk for endometrial carcinoma. (Ref:
Radiological Reasoning: Algorithmic Workup of Abnormal Vaginal
Bleeding with Endovaginal Sonography and Sonohysterography. AJR
1998; 191:S68-73)

## 2016-06-20 MED ORDER — METFORMIN HCL 500 MG PO TABS: 500.0000 mg | ORAL_TABLET | Freq: Every day | ORAL | 3 refills | Status: DC

## 2016-06-20 MED FILL — ?METFORMIN HCL 500MG TABLET: 500 | 30 days supply | Qty: 30 | Fill #0

## 2016-06-21 ENCOUNTER — Ambulatory Visit: Payer: No Typology Code available for payment source | Admitting: Family Medicine

## 2016-06-26 ENCOUNTER — Telehealth: Payer: Self-pay

## 2016-06-26 NOTE — Telephone Encounter (Signed)
-----   Message from Maren Reamer, MD sent at 06/20/2016 11:36 AM EST ----- Please call. Pt has prediabetes. I rx her metformin 500 qday.  Metformin helps your body process sugars better, also helps w/ some weight loss as well.   But can cause some indigestion/n/diarrhea, but sx usually resolved in 2 wks or so.  Kidney function nml, and her blood count back to normal as well compared to 33months ago. thx

## 2016-06-26 NOTE — Telephone Encounter (Signed)
Patient Verify DOB  Patient was aware and understood that she is a prediabetes, and that her PCP rx her some metformin for her to take.  Patient did not have any further questions.

## 2016-10-12 ENCOUNTER — Encounter: Payer: Self-pay | Admitting: Family Medicine

## 2016-10-12 ENCOUNTER — Emergency Department (HOSPITAL_COMMUNITY): Payer: Self-pay

## 2016-10-12 ENCOUNTER — Encounter (HOSPITAL_COMMUNITY): Payer: Self-pay | Admitting: Pharmacy Technician

## 2016-10-12 ENCOUNTER — Emergency Department (HOSPITAL_COMMUNITY)
Admission: EM | Admit: 2016-10-12 | Discharge: 2016-10-13 | Disposition: A | Discharge status: Home / Self Care | Payer: Self-pay | Attending: Emergency Medicine | Admitting: Emergency Medicine

## 2016-10-12 ENCOUNTER — Ambulatory Visit (INDEPENDENT_AMBULATORY_CARE_PROVIDER_SITE_OTHER): Payer: Self-pay | Admitting: Family Medicine

## 2016-10-12 VITALS — BP 116/77 | HR 98 | Temp 99.1°F | Resp 18 | Ht <= 58 in | Wt 192.0 lb

## 2016-10-12 DIAGNOSIS — R079 Chest pain, unspecified: Secondary | ICD-10-CM | POA: Insufficient documentation

## 2016-10-12 DIAGNOSIS — R002 Palpitations: Secondary | ICD-10-CM

## 2016-10-12 DIAGNOSIS — R072 Precordial pain: Secondary | ICD-10-CM

## 2016-10-12 DIAGNOSIS — Z7984 Long term (current) use of oral hypoglycemic drugs: Secondary | ICD-10-CM | POA: Insufficient documentation

## 2016-10-12 DIAGNOSIS — I10 Essential (primary) hypertension: Secondary | ICD-10-CM | POA: Insufficient documentation

## 2016-10-12 DIAGNOSIS — R0602 Shortness of breath: Secondary | ICD-10-CM | POA: Insufficient documentation

## 2016-10-12 DIAGNOSIS — R0600 Dyspnea, unspecified: Secondary | ICD-10-CM | POA: Insufficient documentation

## 2016-10-12 LAB — CBC WITH DIFFERENTIAL/PLATELET
Basophils Absolute: 0 10*3/uL (ref 0.0–0.1)
Basophils Relative: 0 %
Eosinophils Absolute: 0.1 10*3/uL (ref 0.0–0.7)
Eosinophils Relative: 1 %
HCT: 37.9 % (ref 36.0–46.0)
Hemoglobin: 12.3 g/dL (ref 12.0–15.0)
Lymphocytes Relative: 19 %
Lymphs Abs: 2.5 10*3/uL (ref 0.7–4.0)
MCH: 26.6 pg (ref 26.0–34.0)
MCHC: 32.5 g/dL (ref 30.0–36.0)
MCV: 81.9 fL (ref 78.0–100.0)
Monocytes Absolute: 0.5 10*3/uL (ref 0.1–1.0)
Monocytes Relative: 4 %
Neutro Abs: 10.2 10*3/uL — ABNORMAL HIGH (ref 1.7–7.7)
Neutrophils Relative %: 76 %
Platelets: 312 10*3/uL (ref 150–400)
RBC: 4.63 MIL/uL (ref 3.87–5.11)
RDW: 14.8 % (ref 11.5–15.5)
Smear Review: ADEQUATE
WBC: 13.3 10*3/uL — ABNORMAL HIGH (ref 4.0–10.5)

## 2016-10-12 LAB — BASIC METABOLIC PANEL
Anion gap: 7 (ref 5–15)
BUN: 7 mg/dL (ref 6–20)
CO2: 25 mmol/L (ref 22–32)
Calcium: 8.6 mg/dL — ABNORMAL LOW (ref 8.9–10.3)
Chloride: 104 mmol/L (ref 101–111)
Creatinine, Ser: 0.6 mg/dL (ref 0.44–1.00)
GFR calc Af Amer: >60 mL/min (ref >60)
GFR calc non Af Amer: >60 mL/min (ref >60)
Glucose, Bld: 97 mg/dL (ref 65–99)
Potassium: 3.5 mmol/L (ref 3.5–5.1)
Sodium: 136 mmol/L (ref 135–145)

## 2016-10-12 LAB — I-STAT TROPONIN, ED
Troponin i, poc: 0.02 ng/mL (ref 0.00–0.08)
Troponin i, poc: 0 ng/mL (ref 0.00–0.08)

## 2016-10-12 LAB — MAGNESIUM: Magnesium: 2 mg/dL (ref 1.7–2.4)

## 2016-10-12 LAB — BRAIN NATRIURETIC PEPTIDE: B Natriuretic Peptide: 21.6 pg/mL (ref 0.0–100.0)

## 2016-10-12 MED ORDER — ASPIRIN 325 MG PO TABS: 325.0000 mg | ORAL_TABLET | Freq: Every day | ORAL | Status: DC

## 2016-10-12 MED ORDER — NITROGLYCERIN 0.3 MG SL SUBL
0.4000 mg | SUBLINGUAL_TABLET | SUBLINGUAL | Status: DC | PRN
  Administered 2016-10-12: 0.3 mg via SUBLINGUAL

## 2016-10-12 NOTE — ED Provider Notes (Signed)
[facility] DEPT Provider Note   CSN: [id number] Arrival date & time: 10/12/16  1851     History   Chief Complaint Chief Complaint  Patient presents with  . Chest Pain    HPI Darlene Salazar is a 46 y.o. female.  The history is provided by the patient.  Chest Pain   This is a new problem. The current episode started yesterday. The problem occurs constantly. The problem has been gradually improving. The pain is associated with rest. The pain is present in the substernal region and lateral region. The pain is at a severity of 5/10. The pain is moderate. The quality of the pain is described as pressure-like. The pain radiates to the left arm. The symptoms are aggravated by certain positions. Associated symptoms include PND and shortness of breath. Pertinent negatives include no abdominal pain, no back pain, no claudication, no cough, no diaphoresis, no dizziness, no exertional chest pressure, no fever, no headaches, no hemoptysis, no irregular heartbeat, no leg pain, no lower extremity edema, no malaise/fatigue, no nausea, no near-syncope, no numbness, no orthopnea, no palpitations, no sputum production, no syncope, no vomiting and no weakness. She has tried rest and nitroglycerin for the symptoms. The treatment provided moderate relief. Risk factors include obesity.  Pertinent negatives for past medical history include no CAD, no COPD, no PE and no seizures.  Pertinent negatives for family medical history include: no heart disease.  Procedure history is negative for cardiac catheterization and echocardiogram.    Past Medical History:  Diagnosis Date  . Anemia   . Depression   . High cholesterol   . Hypertension    no med, patient denies  . Menorrhagia   . Shortness of breath dyspnea    with exertion    Patient Active Problem List   Diagnosis Date Noted  . Palpitations 02/14/2016  . Atypical chest pain 02/14/2016  . Post-operative state 11/29/2015  . Menorrhagia with  regular cycle 07/28/2015  . 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 Anxiety 05/21/2014  . Morbid obesity ([place]) 05/21/2014  . Dysmenorrhea 05/21/2014    Past Surgical History:  Procedure Laterality Date  . CHOLECYSTECTOMY    . LAPAROSCOPIC VAGINAL HYSTERECTOMY WITH SALPINGECTOMY Bilateral 11/29/2015   Procedure: LAPAROSCOPIC ASSISTED VAGINAL HYSTERECTOMY WITH SALPINGECTOMY;  Surgeon: Emily Filbert, MD;  Location: [facility];  Service: Gynecology;  Laterality: Bilateral;  . WISDOM TOOTH EXTRACTION      OB History    Gravida Para Term Preterm AB Living   5 3 3  0 2 3   SAB TAB Ectopic Multiple Live Births   2 0 0 0         Home Medications    Prior to Admission medications   Medication Sig Start Date End Date Taking? Authorizing Provider  loratadine (CLARITIN) 10 MG tablet Take 1 tablet (10 mg total) by mouth daily. Patient not taking: Reported on 10/12/2016 06/19/16   Maren Reamer, MD  metFORMIN (GLUCOPHAGE) 500 MG tablet Take 1 tablet (500 mg total) by mouth daily with breakfast. Patient not taking: Reported on 10/12/2016 06/20/16   Maren Reamer, MD    Family History Family History  Problem Relation Age of Onset  . Diabetes Father   . Hyperlipidemia Father   . Hypertension Father   . Cancer Mother 14    uterine  . Diabetes Brother     Social History Social History  Substance Use Topics  . Smoking status: Never Smoker  . Smokeless tobacco: Never Used  .  Alcohol use No     Allergies   Patient has no known allergies.   Review of Systems Review of Systems  Constitutional: Negative for chills, diaphoresis, fever and malaise/fatigue.  HENT: Negative for ear pain and sore throat.   Eyes: Negative for pain and visual disturbance.  Respiratory: Positive for shortness of breath. Negative for cough, hemoptysis and sputum production.   Cardiovascular: Positive for chest pain and PND. Negative for palpitations, orthopnea, claudication, syncope and near-syncope.    Gastrointestinal: Negative for abdominal pain, nausea and vomiting.  Genitourinary: Negative for dysuria and hematuria.  Musculoskeletal: Negative for arthralgias and back pain.  Skin: Negative for color change and rash.  Neurological: Negative for dizziness, seizures, syncope, weakness, numbness and headaches.  All other systems reviewed and are negative.    Physical Exam Updated Vital Signs BP 114/75   Pulse 94   Temp 98.9 F (37.2 C) (Oral)   Resp 20   LMP 11/12/2015 (Exact Date) Comment: spotting  SpO2 99%   Physical Exam  Constitutional: She appears well-developed and well-nourished. No distress.  HENT:  Head: Normocephalic and atraumatic.  Eyes: Conjunctivae and EOM are normal.  Neck: Neck supple.  Cardiovascular: Normal rate and regular rhythm.   No murmur heard. Pulmonary/Chest: Effort normal and breath sounds normal. No respiratory distress.  Abdominal: Soft. There is no tenderness.  Musculoskeletal: She exhibits no edema.  Neurological: She is alert.  Skin: Skin is warm and dry.     Psychiatric: She has a normal mood and affect.  Nursing note and vitals reviewed.    ED Treatments / Results  Labs (all labs ordered are listed, but only abnormal results are displayed) Labs Reviewed  CBC WITH DIFFERENTIAL/PLATELET - Abnormal; Notable for the following:       Result Value   WBC 13.3 (*)    Neutro Abs 10.2 (*)    All other components within normal limits  BASIC METABOLIC PANEL - Abnormal; Notable for the following:    Calcium 8.6 (*)    All other components within normal limits  MAGNESIUM  BRAIN NATRIURETIC PEPTIDE  I-STAT TROPOININ, ED  I-STAT TROPOININ, ED  Randolm Idol, ED    EKG  EKG Interpretation None       Radiology Dg Chest 2 View  Result Date: 10/12/2016 CLINICAL DATA:  46 y/o  F; left-sided chest pain. EXAM: CHEST  2 VIEW COMPARISON:  08/21/2015 chest radiograph FINDINGS: Stable cardiac silhouette given projection and technique.  Low lung volumes accentuate pulmonary markings. No focal consolidation, effusion, or pneumothorax identified. Mild spondylosis of thoracic spine. Right upper quadrant cholecystectomy clips. IMPRESSION: No active cardiopulmonary disease. Electronically Signed   By: Kristine Garbe M.D.   On: 10/12/2016 19:59    Procedures Procedures (including critical care time)  Medications Ordered in ED Medications - No data to display   Initial Impression / Assessment and Plan / ED Course  I have reviewed the triage vital signs and the nursing notes.  Pertinent labs & imaging results that were available during my care of the patient were reviewed by me and considered in my medical decision making (see chart for details).     46 year old obese Hispanic female with no significant past history presents in the setting of chest pain. Patient reports she's been having intermittent chest pain and shortness of breath at night for several weeks. She reports last night and persisted through until the morning and for that she came to the emergency department. Patient portion sleeping on more pillows  recently. She does not smoke and patient has no significant family history of cardiovascular disease.  On arrival patient was hemodynamic stable and afebrile. Patient had received nitroglycerin with improvement in symptoms prior to my valuation. Additionally patient received 324 mg of aspirin. Physical exam revealed no significant abnormality's. Troponin 2 was not elevated. No other significant abnormalities on imaging or labs. Presentation concerning for angina versus ACS versus CHF. Patient is a score of 3 on the heart score. At this time believe patient is safe for discharge home with plan for outpatient management of this condition. Patient will require evaluation by cardiology and likely need for echo and stress test. Patient without pain on reevaluation and had extensive discussion of the importance of  follow-up. Strict return precautions were given and advised patient to begin taking 81 mg baby aspirin daily. Patient stable at time of discharge.  Final Clinical Impressions(s) / ED Diagnoses   Final diagnoses:  Chest pain, unspecified type  SOB (shortness of breath)  Nocturnal dyspnea    New Prescriptions New Prescriptions   No medications on file     Esaw Grandchild, MD 10/13/16 [id number]    Virgel Manifold, MD 10/17/16 [phone]

## 2016-10-12 NOTE — ED Notes (Signed)
Patient transported to X-ray 

## 2016-10-12 NOTE — ED Triage Notes (Signed)
Pt reports to the ED via EMS from [facility] with reports of L sided CP that radiates into her L shoulder and arm. Pt given 324mg  Aspirin and 2 sublingual Nitroglycerin prior to arrival with little to no Improvement. VSS with EMS.

## 2016-10-12 NOTE — Patient Instructions (Addendum)
Go to [facility] Address: [address], [place], [place] [address] Phone: [phone]     IF you received an x-ray today, you will receive an invoice from [facility]. Please contact [facility] at [phone] with questions or concerns regarding your invoice.   IF you received labwork today, you will receive an invoice from [facility]. Please contact [facility] at [phone] with questions or concerns regarding your invoice.   Our billing staff will not be able to assist you with questions regarding bills from these companies.  You will be contacted with the lab results as soon as they are available. The fastest way to get your results is to activate your My Chart account. Instructions are located on the last page of this paperwork. If you have not heard from [place] regarding the results in 2 weeks, please contact this office.

## 2016-10-12 NOTE — Progress Notes (Signed)
Chief Complaint  Patient presents with  . Chest Pain    started recently and is in center of chest; denies any family hx of heat diease  . Breathing Problem    hard to breathe once pain starts  . Numbness    left arm feels numb  . Palpitations    states it feels like her heart stops with the pain    HPI  ID number [id number]  Pt with chest pain that is on the left side of her chest that started over night In the night when she developed the chest pain she could not sleep It felt like the pain was in her heart She states that there is a very sharp pain in her heart which causes her to turn over to her right side She took an aspirin in the morning but the pain has not stopped She rates her pain as a 5/10 She ate a piece of bread is all she ate for dinner  She reports that she has a history of hypertension, no family history of heart attack She reports that she is prediabetic  She states that when she takes a deep breath it helps her chest pain.  She takes 10 deep breaths and feels more comfortable   She reports that she has numbness in her left arm.    Past Medical History:  Diagnosis Date  . Anemia   . Depression   . High cholesterol   . Hypertension    no med, patient denies  . Menorrhagia   . Shortness of breath dyspnea    with exertion    Current Outpatient Prescriptions  Medication Sig Dispense Refill  . loratadine (CLARITIN) 10 MG tablet Take 1 tablet (10 mg total) by mouth daily. (Patient not taking: Reported on 10/12/2016) 30 tablet 11  . metFORMIN (GLUCOPHAGE) 500 MG tablet Take 1 tablet (500 mg total) by mouth daily with breakfast. (Patient not taking: Reported on 10/12/2016) 90 tablet 3   Current Facility-Administered Medications  Medication Dose Route Frequency Provider Last Rate Last Dose  . nitroGLYCERIN (NITROSTAT) SL tablet 0.3 mg  0.3 mg Sublingual Q5 min PRN Forrest Moron, MD        Allergies: No Known Allergies  Past Surgical History:  Procedure  Laterality Date  . CHOLECYSTECTOMY    . LAPAROSCOPIC VAGINAL HYSTERECTOMY WITH SALPINGECTOMY Bilateral 11/29/2015   Procedure: LAPAROSCOPIC ASSISTED VAGINAL HYSTERECTOMY WITH SALPINGECTOMY;  Surgeon: Emily Filbert, MD;  Location: [facility];  Service: Gynecology;  Laterality: Bilateral;  . WISDOM TOOTH EXTRACTION      Social History   Social History  . Marital status: Married    Spouse name: N/A  . Number of children: N/A  . Years of education: N/A   Social History Main Topics  . Smoking status: Never Smoker  . Smokeless tobacco: Never Used  . Alcohol use No  . Drug use: No  . Sexual activity: Yes    Birth control/ protection: None   Other Topics Concern  . None   Social History Narrative   ** Merged History Encounter **        ROS See hpi  Objective: Vitals:   10/12/16 1701  BP: 116/77  Pulse: 98  Resp: 18  Temp: 99.1 F (37.3 C)  TempSrc: Oral  SpO2: 98%  Weight: 192 lb (87.1 kg)  Height: 4\' 9"  (1.448 m)   Body mass index is 41.55 kg/m.  Physical Exam  Constitutional: She is oriented to person, place, and  time. She appears well-developed and well-nourished.  HENT:  Head: Normocephalic and atraumatic.  Right Ear: External ear normal.  Left Ear: External ear normal.  Cardiovascular: Normal rate, regular rhythm and normal heart sounds.   No murmur heard. Pulmonary/Chest: Effort normal and breath sounds normal. No respiratory distress. She has no wheezes.  Neurological: She is alert and oriented to person, place, and time.    Assessment and Plan Anayeli was seen today for chest pain, breathing problem, numbness and palpitations.  Diagnoses and all orders for this visit:  Palpitations -     EKG 12-Lead  Precordial pain -     nitroGLYCERIN (NITROSTAT) SL tablet 0.3 mg; Place 1 tablet (0.3 mg total) under the tongue every 5 (five) minutes as needed for chest pain.   This is a 46yo F with history of hypertension and prediabetes who present for evaluation for  acute chest pain, left chest, radiating to the left arm Her ECG was normal.  We are unable to get a stat troponin and the patient is very uncomfortable. Advised pt to go to [facility] for evaluation as I cannot rule out NSTEMI She could also have GERD as a cause for her pain but ACS needs to be ruled out Nitroglycerin improved her pain therefore we are transferring patient to the ER by EMS  A total of 25 minutes were spent face-to-face with the patient during this encounter and over half of that time was spent on counseling and coordination of care.  [place]

## 2016-10-12 NOTE — Progress Notes (Signed)
Patient given 4 chewable 81 mg ASA at the request of Dr. Nolon Rod. Philis Fendt, MS, PA-C 6:12 PM, 10/12/2016

## 2016-10-13 LAB — LIPID PANEL
Cholesterol: 217 mg/dL — ABNORMAL HIGH (ref 0–200)
HDL: 38 mg/dL — ABNORMAL LOW (ref >40)
LDL Cholesterol: 151 mg/dL — ABNORMAL HIGH (ref 0–99)
Total CHOL/HDL Ratio: 5.7 RATIO
Triglycerides: 139 mg/dL (ref <150)
VLDL: 28 mg/dL (ref 0–40)

## 2016-10-18 ENCOUNTER — Encounter: Payer: Self-pay | Admitting: Cardiology

## 2016-10-18 ENCOUNTER — Ambulatory Visit (INDEPENDENT_AMBULATORY_CARE_PROVIDER_SITE_OTHER): Payer: Self-pay | Admitting: Cardiology

## 2016-10-18 VITALS — BP 120/74 | HR 91 | Ht <= 58 in | Wt 192.4 lb

## 2016-10-18 DIAGNOSIS — R079 Chest pain, unspecified: Secondary | ICD-10-CM

## 2016-10-18 DIAGNOSIS — R0789 Other chest pain: Secondary | ICD-10-CM

## 2016-10-18 NOTE — Patient Instructions (Signed)
Medication Instructions:  The current medical regimen is effective;  continue present plan and medications.  Testing/Procedures: Your physician has requested that you have an echocardiogram. Echocardiography is a painless test that uses sound waves to create images of your heart. It provides your doctor with information about the size and shape of your heart and how well your heart's chambers and valves are working. This procedure takes approximately one hour. There are no restrictions for this procedure.  Follow-Up: Follow up as needed with Dr Marlou Porch.  Thank you for choosing [facility]!!    Ecocardiograma (Echocardiogram) El ecocardiograma utiliza ondas sonoras (ultrasonido) para obtener una imagen del corazn. El ecocardiograma es simple e indoloro, se obtiene en un perodo de tiempo breve y proporciona informacin valiosa a su mdico. Las imgenes de un ecocardiograma pueden proporcionar el siguiente tipo de informacin:  Evidencia de enfermedad arterial coronaria (EAC).  Tamao del corazn.  Funcin del msculo cardaco.  Funcin de la vlvula cardaca.  Deteccin de aneurisma.  Evidencia de un infarto de miocardio pasado.  Acumulacin de lquido alrededor del corazn.  Engrosamiento del msculo cardaco.  Evaluacin de la funcin de la vlvula cardaca. INFORME A SU MDICO:  Cualquier alergia que tenga.  Todos los [employer], incluidos vitaminas, hierbas, gotas oftlmicas, cremas y medicamentos de venta libre.  Problemas previos que usted o los [employer] de su familia hayan tenido con el uso de anestsicos.  Enfermedades de la sangre que tenga.  Cirugas previas.  Enfermedades que tenga.  Posibilidad de embarazo, si corresponde. ANTES DEL PROCEDIMIENTO No se requiere una preparacin especial. Coma y beba con normalidad. PROCEDIMIENTO  Para lograr una imagen del corazn, se aplicar un gel en el pecho y luego se pasar un instrumento  similar a una vara (transductor) sobre este. El gel ayudar a transmitir las [employer] del transductor. Las ondas sonoras rebotarn inofensivamente en el corazn para permitir capturar las imgenes del corazn en movimiento, en tiempo real. Luego, las imgenes se Clinical cytogeneticist.  Quiz necesite una va IV para recibir un medicamento que mejore la calidad de las imgenes. DESPUS DEL PROCEDIMIENTO Puede retomar su rutina normal, incluidos la dieta, las actividades y [employer], a menos que su mdico le indique lo contrario. Esta informacin no tiene Marine scientist el consejo del mdico. Asegrese de hacerle al mdico cualquier pregunta que tenga. Document Released: 06/04/2005 Document Revised: 06/25/2014 Document Reviewed: 02/09/2013 Elsevier Interactive Patient Education  2017 [employer].

## 2016-10-18 NOTE — Progress Notes (Signed)
Cardiology Office Note    Date:  10/18/2016   ID:  Darlene Salazar, DOB May 07, 1971, MRN [id number]  PCP:  Maren Reamer, MD  Cardiologist:   Candee Furbish, MD     History of Present Illness:  Darlene Salazar is a 46 y.o. female here for the follow up of chest pain. She was seen in the emergency room on 10/12/16 with over a day of chest discomfort. Thankfully troponin was normal. EKG unremarkable. Prior echocardiogram unremarkable. In her ER note it was mentioned that prior cardiac catheterization was unremarkable as well.   I previously saw her for palpitations and left arm paresthesias at the request of Dr. Clovia Cuff of OB-GYN.  Had LAVH for chronic pelvic pain, fibroids, bleeding. Since surgery has hot flashes and palpitations.   No DM, no tob, no fam hx of CAD. Mild back pain.   Given her hot flashes, FSH was drawn by Dr. Hulan Fray. 30.  Back in 2013 she had atypical chest pain, stress echocardiogram which was reassuring, normal EF.   Past Medical History:  Diagnosis Date  . Anemia   . Depression   . High cholesterol   . Hypertension    no med, patient denies  . Menorrhagia   . Shortness of breath dyspnea    with exertion    Past Surgical History:  Procedure Laterality Date  . CHOLECYSTECTOMY    . LAPAROSCOPIC VAGINAL HYSTERECTOMY WITH SALPINGECTOMY Bilateral 11/29/2015   Procedure: LAPAROSCOPIC ASSISTED VAGINAL HYSTERECTOMY WITH SALPINGECTOMY;  Surgeon: Emily Filbert, MD;  Location: [facility];  Service: Gynecology;  Laterality: Bilateral;  . WISDOM TOOTH EXTRACTION      Current Medications: Outpatient Medications Prior to Visit  Medication Sig Dispense Refill  . loratadine (CLARITIN) 10 MG tablet Take 1 tablet (10 mg total) by mouth daily. (Patient not taking: Reported on 10/12/2016) 30 tablet 11  . metFORMIN (GLUCOPHAGE) 500 MG tablet Take 1 tablet (500 mg total) by mouth daily with breakfast. (Patient not taking: Reported on 10/12/2016) 90 tablet 3    Facility-Administered Medications Prior to Visit  Medication Dose Route Frequency Provider Last Rate Last Dose  . aspirin tablet 325 mg  325 mg Oral Daily Zoe A Nolon Rod, MD      . nitroGLYCERIN (NITROSTAT) SL tablet 0.3 mg  0.3 mg Sublingual Q5 min PRN Forrest Moron, MD   0.3 mg at 10/12/16 1744     Allergies:   Patient has no known allergies.   Social History   Social History  . Marital status: Married    Spouse name: N/A  . Number of children: N/A  . Years of education: N/A   Social History Main Topics  . Smoking status: Never Smoker  . Smokeless tobacco: Never Used  . Alcohol use No  . Drug use: No  . Sexual activity: Yes    Birth control/ protection: None   Other Topics Concern  . None   Social History Narrative   ** Merged History Encounter **         Family History:  The patient's family history includes Cancer (age of onset: 12) in her mother; Diabetes in her brother and father; Hyperlipidemia in her father; Hypertension in her father.   ROS:   Please see the history of present illness.    ROS All other systems reviewed and are negative.Denies any syncope, orthopnea, PND, fevers.   PHYSICAL EXAM:   VS:  BP 120/74   Pulse 91   Ht 4\' 10"  (1.473 m)  Wt 192 lb 6.4 oz (87.3 kg)   LMP 11/12/2015 (Exact Date) Comment: spotting  BMI 40.21 kg/m    GEN: Well nourished, well developed, in no acute distress  HEENT: normal  Neck: no JVD, carotid bruits, or masses Cardiac: RRR; no murmurs, NO rubs, or gallops,no edema  Respiratory:  clear to auscultation bilaterally, normal work of breathing GI: soft, nontender, nondistended, + BS, overweight MS: no deformity or atrophy  Skin: warm and dry, no rash Neuro:  Alert and Oriented x 3, Strength and sensation are intact Psych: euthymic mood, full affect   Wt Readings from Last 3 Encounters:  10/18/16 192 lb 6.4 oz (87.3 kg)  10/12/16 192 lb (87.1 kg)  06/19/16 197 lb 3.2 oz (89.4 kg)      Studies/Labs  Reviewed:   EKG:  EKG is ordered today.  The ekg ordered today demonstrates 02/14/16-sinus rhythm, 69, no other abnormalities.  Recent Labs: 10/12/2016: B Natriuretic Peptide 21.6; BUN 7; Creatinine, Ser 0.60; Hemoglobin 12.3; Magnesium 2.0; Platelets 312; Potassium 3.5; Sodium 136   Lipid Panel    Component Value Date/Time   CHOL 217 (H) 10/12/2016 2035   TRIG 139 10/12/2016 2035   HDL 38 (L) 10/12/2016 2035   CHOLHDL 5.7 10/12/2016 2035   VLDL 28 10/12/2016 2035   LDLCALC 151 (H) 10/12/2016 2035    Additional studies/ records that were reviewed today include:   Prior office notes, lab work reviewed [facility] 30 which states it is in the postmenopausal range. Prior stress echocardiogram in 2013 was reassuring, no ischemia, normal EF. Prior hemoglobin in the upper 7 range prior to surgery.    ASSESSMENT:    1. Chest pain, unspecified type   2. Atypical chest pain   3. Morbid obesity due to excess calories ([place])      PLAN:  In order of problems listed above:  Atypical chest pain  - Needle like chest pain, musculoskeletal Like, pressure lasting many hours-troponin reassuring EKG reassuring x-ray reassuring emergency room.. Noncardiac. Reassurance. Prior stress test reassuring.Prior negative cardiac catheterization. Pressure whole day Thursday. I will check an echocardiogram to ensure there is no effusion and to ensure proper structure and function of her heart.  Shortness of breath  -This seems to have resolved.  Obesity, morbid  - Continue to encourage weight loss.  As needed follow-up.   Medication Adjustments/Labs and Tests Ordered: Current medicines are reviewed at length with the patient today.  Concerns regarding medicines are outlined above.  Medication changes, Labs and Tests ordered today are listed in the Patient Instructions below. Patient Instructions  Medication Instructions:  The current medical regimen is effective;  continue present plan and  medications.  Testing/Procedures: Your physician has requested that you have an echocardiogram. Echocardiography is a painless test that uses sound waves to create images of your heart. It provides your doctor with information about the size and shape of your heart and how well your heart's chambers and valves are working. This procedure takes approximately one hour. There are no restrictions for this procedure.  Follow-Up: Follow up as needed with Dr Marlou Porch.  Thank you for choosing [facility]!!    Ecocardiograma (Echocardiogram) El ecocardiograma utiliza ondas sonoras (ultrasonido) para obtener una imagen del corazn. El ecocardiograma es simple e indoloro, se obtiene en un perodo de tiempo breve y proporciona informacin valiosa a su mdico. Las imgenes de un ecocardiograma pueden proporcionar el siguiente tipo de informacin:  Evidencia de enfermedad arterial coronaria (EAC).  Tamao del corazn.  Funcin del msculo cardaco.  Funcin de la vlvula cardaca.  Deteccin de aneurisma.  Evidencia de un infarto de miocardio pasado.  Acumulacin de lquido alrededor del corazn.  Engrosamiento del msculo cardaco.  Evaluacin de la funcin de la vlvula cardaca. INFORME A SU MDICO:  Cualquier alergia que tenga.  Todos los [employer], incluidos vitaminas, hierbas, gotas oftlmicas, cremas y medicamentos de venta libre.  Problemas previos que usted o los UnitedHealth de su familia hayan tenido con el uso de anestsicos.  Enfermedades de la sangre que tenga.  Cirugas previas.  Enfermedades que tenga.  Posibilidad de embarazo, si corresponde. ANTES DEL PROCEDIMIENTO No se requiere una preparacin especial. Coma y beba con normalidad. PROCEDIMIENTO  Para lograr una imagen del corazn, se aplicar un gel en el pecho y luego se pasar un instrumento similar a una vara (transductor) sobre este. El gel ayudar a transmitir las [employer] del  transductor. Las ondas sonoras rebotarn inofensivamente en el corazn para permitir capturar las imgenes del corazn en movimiento, en tiempo real. Luego, las imgenes se Clinical cytogeneticist.  Quiz necesite una va IV para recibir un medicamento que mejore la calidad de las imgenes. DESPUS DEL PROCEDIMIENTO Puede retomar su rutina normal, incluidos la dieta, las actividades y [employer]es, a menos que su mdico le indique lo contrario. Esta informacin no tiene Marine scientist el consejo del mdico. Asegrese de hacerle al mdico cualquier pregunta que tenga. Document Released: 06/04/2005 Document Revised: 06/25/2014 Document Reviewed: 02/09/2013 Elsevier Interactive Patient Education  2017 [place], MD  10/18/2016 12:12 PM    [address], [place], [place]  [address] Phone: [phone]; Fax: [phone]

## 2016-10-22 ENCOUNTER — Ambulatory Visit: Payer: Self-pay | Admitting: Internal Medicine

## 2016-10-29 ENCOUNTER — Encounter: Payer: Self-pay | Admitting: Internal Medicine

## 2016-10-29 ENCOUNTER — Ambulatory Visit: Payer: Self-pay | Attending: Internal Medicine | Admitting: Internal Medicine

## 2016-10-29 VITALS — BP 124/86 | HR 81 | Temp 98.2°F | Resp 16 | Wt 190.2 lb

## 2016-10-29 DIAGNOSIS — Z09 Encounter for follow-up examination after completed treatment for conditions other than malignant neoplasm: Secondary | ICD-10-CM | POA: Insufficient documentation

## 2016-10-29 DIAGNOSIS — E7800 Pure hypercholesterolemia, unspecified: Secondary | ICD-10-CM | POA: Insufficient documentation

## 2016-10-29 DIAGNOSIS — E785 Hyperlipidemia, unspecified: Secondary | ICD-10-CM

## 2016-10-29 DIAGNOSIS — K219 Gastro-esophageal reflux disease without esophagitis: Secondary | ICD-10-CM

## 2016-10-29 DIAGNOSIS — F329 Major depressive disorder, single episode, unspecified: Secondary | ICD-10-CM | POA: Insufficient documentation

## 2016-10-29 DIAGNOSIS — I10 Essential (primary) hypertension: Secondary | ICD-10-CM | POA: Insufficient documentation

## 2016-10-29 DIAGNOSIS — R7303 Prediabetes: Secondary | ICD-10-CM

## 2016-10-29 DIAGNOSIS — Z7984 Long term (current) use of oral hypoglycemic drugs: Secondary | ICD-10-CM | POA: Insufficient documentation

## 2016-10-29 DIAGNOSIS — R0789 Other chest pain: Secondary | ICD-10-CM

## 2016-10-29 DIAGNOSIS — Z789 Other specified health status: Secondary | ICD-10-CM

## 2016-10-29 DIAGNOSIS — Z7982 Long term (current) use of aspirin: Secondary | ICD-10-CM | POA: Insufficient documentation

## 2016-10-29 DIAGNOSIS — Z758 Other problems related to medical facilities and other health care: Secondary | ICD-10-CM

## 2016-10-29 MED ORDER — METFORMIN HCL 500 MG PO TABS: 500.0000 mg | ORAL_TABLET | Freq: Every day | ORAL | 3 refills | Status: DC

## 2016-10-29 MED ORDER — PANTOPRAZOLE SODIUM 40 MG PO TBEC: 40.0000 mg | DELAYED_RELEASE_TABLET | Freq: Every day | ORAL | 3 refills | Status: DC

## 2016-10-29 NOTE — Patient Instructions (Addendum)
Opciones de alimentos para pacientes con reflujo gastroesofgico - Adultos (Food Choices for Gastroesophageal Reflux Disease, Adult) Cuando se tiene reflujo gastroesofgico (ERGE), los alimentos que se ingieren y los hbitos de alimentacin son muy importantes. Elegir los alimentos adecuados puede ayudar a [employer]. [place]?  Elija las frutas, los vegetales, los cereales integrales y los productos lcteos con bajo contenido de [place].  [place], de pescado y de ave con bajo contenido de grasas.  Limite las grasas, como los [place], los aderezos para [place], la [place], los frutos secos y Publishing copy.  Lleve un registro de alimentos. Esto ayuda a identificar los alimentos que ocasionan sntomas.  Evite los alimentos que le ocasionen sntomas. Pueden ser distintos para cada persona.  Haga comidas pequeas durante Psychiatrist de 3 comidas abundantes.  Coma lentamente, en un lugar donde est distendido.  Limite el consumo de alimentos fritos.  Cocine los alimentos utilizando mtodos que no sean la fritura.  Evite el consumo alcohol.  Evite beber grandes cantidades de lquidos con las comidas.  Evite agacharse o recostarse hasta despus de 2 o 3horas de haber comido. QU ALIMENTOS NO SE RECOMIENDAN? Estos son algunos alimentos y bebidas que pueden empeorar los sntomas: Astronomer. Jugo de tomate. Salsa de tomate y espagueti. Ajes. [place] y [place]. Rbano picante. Frutas [place], pomelos y limn (fruta y [place]). Carnes Carnes de [place], de pescado y de ave con gran contenido de grasas. Esto incluye los perros calientes, las [place], el [place], la salchicha, el salame y el tocino. Lcteos [place] entera y [place]. Rite Aid. Crema. [place]. Helados. Queso crema. Bebidas T o caf. Bebidas gaseosas o bebidas energizantes. Condimentos Salsa picante. Salsa barbacoa. Dulces/postres Chocolate y cacao. [place].  Menta y mentol. Grasas y Albertson's. Esto incluye las papas fritas. Otros [place]. Especias picantes. Esto incluye la pimienta negra, la pimienta blanca, la pimienta roja, la pimienta de cayena, el curry en polvo, los clavos de [place], el jengibre y el [place] en polvo. Esta no es Dean Foods Company de los alimentos y las bebidas que se Higher education careers adviser. Comunquese con el nutricionista para recibir ms informacin. Esta informacin no tiene Marine scientist el consejo del mdico. Asegrese de hacerle al mdico cualquier pregunta que tenga. Document Released: 12/04/2011 Document Revised: 06/25/2014 Document Reviewed: 04/08/2013 Elsevier Interactive Patient Education  2017 [place] de la diabetes mellitus tipo2 (Preventing Type 2 Diabetes Mellitus) La diabetes tipo2 (diabetes mellitus tipo2) es una enfermedad a largo plazo (crnica) que afecta los niveles de azcar en la sangre (glucosa). Normalmente, una hormona llamada insulina estimula el ingreso de la glucosa en las clulas del cuerpo. Las clulas usan la glucosa para Dealer. En la diabetes tipo2, puede presentarse uno de los siguientes problemas, o ambos:  El organismo no produce la cantidad suficiente de [place].  El organismo no responde de [place] a la insulina que produce (resistencia a la insulina). La resistencia a la insulina o la falta de esta hormona hace que el exceso de glucosa se acumule en la sangre, en lugar de ir a las clulas. Como consecuencia, se desarrolla glucemia alta (hiperglucemia), que puede causar muchas complicaciones. El sobrepeso o la obesidad, y Catering manager un estilo de vida inactivo (sedentario) pueden aumentar el riesgo de tener diabetes. La diabetes tipo2 se puede retardar o evitar al realizar ciertos cambios en la alimentacin y en el estilo de vida. QU CAMBIOS EN  LA ALIMENTACIN SE PUEDEN HACER?  Consuma comidas y colaciones saludables regularmente.  Lleve con usted una colacin saludable para cuando tenga [employer] Energy, por [place], una fruta o un puado de frutos secos.  Coma carne [place] y protenas con bajo contenido de grasas saturadas, como pollo, pescado, huevos blancos y frijoles. Evite las carnes procesadas.  Coma mucha fruta y verdura, y Ardelia Mems cantidad importante de cereales no procesados (cereales integrales). Se recomienda que consuma lo siguiente:  De 1 a 2tazas de frutas US [employer]ways.  De 2 a 3tazas de [employer]W [employer].  De 6onzas (170g) a 8onzas (227g) de cereales integrales todos los [place], como avena, salvado, trigo bulgur, arroz integral, quinua y mijo.  Productos lcteos con bajo contenido de [place], [place], yogur y [place].  Alimentos que contengan grasas saludables, como frutos secos, Musician, aceite de [place] y aceite de canola.  Beba agua [place]. Evite bebidas que contengan ms azcar, como gaseosas y t [place].  Siga las indicaciones del mdico con respecto a las restricciones especficas para las comidas o bebidas.  Controle la cantidad de comida que consume en un momento dado (tamao de la porcin).  Revise las etiquetas de los alimentos para conocer el tamao de la porcin.  Utilice una balanza de cocina para pesar las cantidades de alimentos.  Saltee o cocine al vapor los alimentos en vez de frerlos. Cocine con agua o caldo en vez de aceite o manteca.  Limite la ingesta de lo siguiente:  Sal (sodio). No consuma ms de 1cucharadita (2400mg ) de sodio por da. Si tiene [place] cardiopata o hipertensin arterial, consuma menos de  o de cucharadita (1500mg ) de sodio por da.  Grasas saturadas. Es la grasa que se encuentra en estado slido a temperatura ambiente, como la [place] o la grasa de la carne. QU CAMBIOS EN EL ESTILO DE VIDA SE PUEDEN HACER? Actividad  Haga actividad fsica de intensidad moderada durante al menos 70minutos como mnimo 5das por  semana, o tanto como le haya indicado el mdico.  Pregntele al mdico qu actividades son seguras para usted. Una combinacin de actividades puede ser la mejor opcin, por ejemplo, caminar, practicar natacin, andar en bicicleta y hacer entrenamiento de fuerza.  Trate de agregar la actividad fsica a Conservation officer, nature. Por ejemplo:  Estacione en lugares que estn ms alejados de lo habitual para poder caminar ms. Por ejemplo, estacione en una esquina alejada del estacionamiento cuando vaya a la oficina o a la tienda de comestibles.  D una caminata durante su hora de almuerzo.  Utilice las Clinical cytogeneticist del ascensor o de las escaleras mecnicas. Prdida de peso  Baje de peso segn se le indique. El mdico puede determinar cuntos kilos tiene que bajar y [place] a que adelgace de Geographical information systems officer segura.  Si tiene sobrepeso u obesidad, es posible que se le indique bajar, por lo menos del 5% al 7% del Engineer, site. Alcohol y tabaco  Limite el consumo de alcohol a no ms de 1 medida por da si es mujer y no est Music therapist, y 2 medidas por da si es hombre. Una medida equivale a 12onzas de cerveza, 5onzas de vino o 1onzas de bebidas alcohlicas de alta graduacin.  No consuma ningn producto que contenga tabaco, lo que incluye cigarrillos, tabaco de Higher education careers adviser y Psychologist, sport and exercise. Si necesita ayuda para dejar de fumar, consulte al MeadWestvaco. [place] con el mdico  Contrlese el nivel sanguneo de glucosa con frecuencia como se lo haya  indicado el mdico.  Analice los factores de riesgo y cmo puede reducir el riesgo de tener diabetes.  Hgase las pruebas de [employer]um[employer]ident se lo haya indicado el mdico. Puede hacerse pruebas de deteccin de forma peridica, especialmente si presenta ciertos factores de riesgo para la diabetes tipo2.  Haga una cita con un especialista en alimentacin y nutricin (nutricionista certificado). Un nutricionista certificado puede ayudarlo a preparar un plan de  alimentacin saludable, y a comprender los tamaos de las porciones y las etiquetas de los alimentos. POR QU ESTOS CAMBIOS SON IMPORTANTES?  Al hacer cambios en el estilo de vida y la alimentacin, es posible prevenir o retardar la diabetes tipo2 y los problemas de salud relacionados.  Puede ser difcil reconocer los signos de la diabetes tipo2. La mejor manera de evitar los posibles daos al organismo es tomar medidas para prevenir la enfermedad antes de presentar sntomas. QU PUEDE SUCEDER SI NO SE REALIZAN CAMBIOS?  Los niveles sanguneos de glucosa pueden seguir aumentando. Es peligroso Systems analyst glucemia alta durante mucho tiempo. Demasiada glucosa en la sangre puede daar los vasos sanguneos, el corazn, los riones, los nervios y los ojos.  Puede desarrollar prediabetes o diabetes tipo2. La diabetes tipo2 puede producir muchos problemas de salud crnicos y complicaciones, por ejemplo:  Cardiopata.  Ictus.  Ceguera.  Enfermedad renal.  Depresin.  Mala circulacin en los pies y en las piernas, que podra llevar a la extraccin quirrgica (amputacin) en casos graves. DNDE ENCONTRAR ASISTENCIA:  Pdale al mdico que le recomiende a un nutricionista certificado, a Radio broadcast assistant para el cuidado de la diabetes o un programa para Sports coach de [place].  Busque grupos para bajar de peso locales o en lnea.  Inscrbase en un gimnasio, club de preparacin fsica o grupo de actividades al [employer], [place] un club para salir a Writer. DNDE ENCONTRAR MS INFORMACIN: Para obtener ms informacin sobre la diabetes y la prevencin de la diabetes, visite los siguientes sitios web:  Asociacin Americana de la Diabetes (American Diabetes Association, ADA): www.diabetes.[place] Diabetes y las Enfermedades Digestivas y Renales [facility] of Diabetes and Digestive and Kidney Diseases): FindSpin.nl Para obtener ms informacin  sobre una alimentacin saludable, visite los siguientes sitios web:  Choose My Plate (MiPlato), Departamento de Agricultura de EE.UU. (U.S. Department of Agriculture, Engineer, structural): http://wiley-williams.com/  Lockheed Martin Dietary Guidelines ([place]autas de Designer, multimedia) de la Oficina de Prevencin de Enfermedades y Promocin de Technical sales engineer (Office of Disease Prevention and Health Promotion, [place]): SurferLive.at Resumen  Puede reducir el riesgo de desarrollar diabetes tipo2 al aumentar la actividad fsica, comer alimentos saludables y Sports coach de [place], segn se le indique.  Hable con el mdico sobre el riesgo de desarrollar diabetes tipo2. Pregntele Advance Auto  de sangre o las pruebas de deteccin que deba [place]. Esta informacin no tiene Marine scientist el consejo del mdico. Asegrese de hacerle al mdico cualquier pregunta que tenga. Document Released: 07/26/2015 Document Revised: 07/26/2015 Document Reviewed: 07/26/2015  -   Elsevier Interactive Patient Education  [employer].

## 2016-10-29 NOTE — Progress Notes (Addendum)
Darlene Salazar, is a 46 y.o. female  QPY:[id number]  TIW:[id number]  DOB - Aug 24, 1970  Chief Complaint  Patient presents with  . Follow-up        Subjective:   Darlene Salazar is a 46 y.o. female here today for a follow up visit of chest pain and predm, last seen 07/08/16. Of note, she was seen in ED on 10/12/16 for chest pain/pressure. Subsequently saw cardiology on 10/18/16 and most wkup reassuring for non- cardiac cause. Per Dr Marlou Porch, cardiology noted 10/18/16, troponin was normal. EKG unremarkable. Prior echocardiogram unremarkable. In her ER note it was mentioned that prior cardiac catheterization was unremarkable as well.   She does have echo pending w/ cards.  Prior ccy.  Of note, she states the pain is different now, not pressure-like. She stopped drinking coffee and changed her diet and eating healthier - which is helping. She still occasionally gets sharp aches in her left upper chest, bene   Patient has No headache, No chest pain, No abdominal pain - No Nausea, No new weakness tingling or numbness, No Cough - SOB.  Problem  Prediabetes  Gastroesophageal Reflux Disease Without Esophagitis    ALLERGIES: No Known Allergies  PAST MEDICAL HISTORY: Past Medical History:  Diagnosis Date  . Anemia   . Depression   . High cholesterol   . Hypertension    no med, patient denies  . Menorrhagia   . Shortness of breath dyspnea    with exertion    MEDICATIONS AT HOME: Prior to Admission medications   Medication Sig Start Date End Date Taking? Authorizing Provider  aspirin 81 MG tablet Take 81 mg by mouth daily.    [provider]  metFORMIN (GLUCOPHAGE) 500 MG tablet Take 1 tablet (500 mg total) by mouth daily with breakfast. 10/29/16   Langeland, Dawn T, MD  pantoprazole (PROTONIX) 40 MG tablet Take 1 tablet (40 mg total) by mouth daily. 10/29/16   Maren Reamer, MD     Objective:   Vitals:   10/29/16 0926  BP: 124/86  Pulse: 81  Resp:  16  Temp: 98.2 F (36.8 C)  TempSrc: Oral  SpO2: 99%  Weight: 190 lb 3.2 oz (86.3 kg)    Exam General appearance : Awake, alert, not in any distress. Speech Clear. Not toxic looking HEENT: Atraumatic and Normocephalic, pupils equally reactive to light. Neck: supple, no JVD.  Chest:Good air entry bilaterally, no added sounds.  Mild ttp luq on deep palpation under breast. CVS: S1 S2 regular, no murmurs/gallups or rubs. Abdomen: Bowel sounds active, obese, Non tender. No g/r. Extremities: B/L Lower Ext shows no edema, both legs are warm to touch Neurology: Awake alert, and oriented X 3, CN II-XII grossly intact, Non focal Skin:No Rash  Data Review Lab Results  Component Value Date   HGBA1C 5.8 06/19/2016   HGBA1C 6.0 (H) 08/30/2014    Depression screen PHQ 2/9 10/29/2016 10/12/2016 06/19/2016  Decreased Interest 2 0 2  Down, Depressed, Hopeless 2 0 0  PHQ - 2 Score 4 0 2  Altered sleeping 1 - 2  Tired, decreased energy 2 - 2  Change in appetite 0 - 0  Feeling bad or failure about yourself  0 - 0  Trouble concentrating 2 - 2  Moving slowly or fidgety/restless 0 - 0  Suicidal thoughts 0 - 0  PHQ-9 Score 9 - 8      Assessment & Plan   1. Atypical chest pain Suspect gi, although msk pain possible  as well (pt only does some lifting at home) - H. pylori breath test - Lipid Panel - gerd diet info provided - trial protonix 40 qd.  2. Gastroesophageal reflux disease without esophagitis - H. pylori breath test - trial ppi - see #1  3. Prediabetes aic 5.8 last visit. - dw predm diet w/ pt, Metformin helps your body process sugars better, also helps w/ some weight loss as well.   But can cause some indigestion/n/diarrhea, but sx usually resolved in 2 wks or so. - pt interested in starting metformin 500 qd  4. hld Will check fasting lipids today.  Last check in ER last month was not fasting.  5. Language barrier Interpreter was used to communicate directly with patient  for the entire encounter including providing detailed patient instructions.    Patient have been counseled extensively about nutrition and exercise  Return in about 3 months (around 01/29/2017), or if symptoms worsen or fail to improve.  The patient was given clear instructions to go to ER or return to medical center if symptoms don't improve, worsen or new problems develop. The patient verbalized understanding. The patient was told to call to get lab results if they haven't heard anything in the next week.   This note has been created with Surveyor, quantity. Any transcriptional errors are unintentional.   Maren Reamer, MD, [facility], [place]   10/29/2016, 10:00 AM

## 2016-10-30 LAB — LIPID PANEL
CHOLESTEROL TOTAL: 231 mg/dL — AB (ref 100–199)
Chol/HDL Ratio: 5.5 ratio — ABNORMAL HIGH (ref 0.0–4.4)
HDL: 42 mg/dL (ref >39)
LDL Calculated: 148 mg/dL — ABNORMAL HIGH (ref 0–99)
Triglycerides: 207 mg/dL — ABNORMAL HIGH (ref 0–149)
VLDL CHOLESTEROL CAL: 41 mg/dL — AB (ref 5–40)

## 2016-10-31 ENCOUNTER — Encounter: Payer: Self-pay | Admitting: Gynecology

## 2016-10-31 LAB — H. PYLORI BREATH TEST

## 2016-10-31 LAB — H.PYLORI BREATH TEST (REFLEX): H. pylori Breath Test: POSITIVE — AB

## 2016-11-01 ENCOUNTER — Encounter: Payer: Self-pay | Admitting: Internal Medicine

## 2016-11-02 ENCOUNTER — Encounter: Payer: Self-pay | Admitting: Internal Medicine

## 2016-11-05 ENCOUNTER — Ambulatory Visit: Payer: Self-pay

## 2016-11-05 ENCOUNTER — Other Ambulatory Visit: Payer: Self-pay | Admitting: Internal Medicine

## 2016-11-05 ENCOUNTER — Encounter: Payer: Self-pay | Admitting: Internal Medicine

## 2016-11-05 MED ORDER — AMOXICILLIN 500 MG PO TABS: 1000.0000 mg | ORAL_TABLET | Freq: Two times a day (BID) | ORAL | 0 refills | Status: DC

## 2016-11-05 MED ORDER — CLARITHROMYCIN 500 MG PO TABS: 500.0000 mg | ORAL_TABLET | Freq: Two times a day (BID) | ORAL | 0 refills | Status: DC

## 2016-11-05 MED ORDER — PANTOPRAZOLE SODIUM 40 MG PO TBEC: 40.0000 mg | DELAYED_RELEASE_TABLET | Freq: Two times a day (BID) | ORAL | 0 refills | Status: DC

## 2016-11-06 ENCOUNTER — Ambulatory Visit (HOSPITAL_COMMUNITY): Payer: Self-pay | Attending: Cardiovascular Disease

## 2016-11-06 ENCOUNTER — Other Ambulatory Visit: Payer: Self-pay

## 2016-11-06 ENCOUNTER — Encounter (HOSPITAL_COMMUNITY): Payer: Self-pay | Admitting: *Deleted

## 2016-11-06 DIAGNOSIS — R079 Chest pain, unspecified: Secondary | ICD-10-CM | POA: Insufficient documentation

## 2016-11-06 DIAGNOSIS — E785 Hyperlipidemia, unspecified: Secondary | ICD-10-CM | POA: Insufficient documentation

## 2016-11-06 DIAGNOSIS — I429 Cardiomyopathy, unspecified: Secondary | ICD-10-CM | POA: Insufficient documentation

## 2016-11-06 DIAGNOSIS — I071 Rheumatic tricuspid insufficiency: Secondary | ICD-10-CM | POA: Insufficient documentation

## 2016-11-06 DIAGNOSIS — I10 Essential (primary) hypertension: Secondary | ICD-10-CM | POA: Insufficient documentation

## 2016-11-06 NOTE — Progress Notes (Unsigned)
Interpreter Charolotte Capuchin was present for the echo exam.

## 2016-11-07 ENCOUNTER — Other Ambulatory Visit: Payer: Self-pay | Admitting: Pharmacist

## 2016-11-07 ENCOUNTER — Telehealth: Payer: Self-pay

## 2016-11-07 LAB — GLUCOSE, POCT (MANUAL RESULT ENTRY): POC Glucose: 100 mg/dl — AB (ref 70–99)

## 2016-11-07 MED ORDER — AMOXICILLIN 500 MG PO CAPS: 1000.0000 mg | ORAL_CAPSULE | Freq: Two times a day (BID) | ORAL | 0 refills | Status: DC

## 2016-11-07 NOTE — Telephone Encounter (Signed)
[place] [id number] contacted pt to go over lab results pt didn't answer lvm asking pt to give me a call at her earliest convienence   If pt calls back please give results: Pt has h pylori. rx for clarithromycin, amoxicillin and protonix sent to [facility]. Needs to take 2wks, than make appt to come back to check clearance. Her cholesterol screening was elevated too. To address this please limit saturated fat to no more than 7% of your calories, limit cholesterol to 200 mg/day, increase fiber and exercise as tolerated. If needed we may add a cholesterol lowering medication to your regimen later.

## 2016-11-16 ENCOUNTER — Telehealth: Payer: Self-pay | Admitting: Internal Medicine

## 2016-11-16 ENCOUNTER — Ambulatory Visit: Payer: Self-pay | Attending: Internal Medicine

## 2016-11-16 NOTE — Telephone Encounter (Signed)
Patient came into office requesting lab results, per note patient is aware of medication sent to pharmacy. Patient will pick up medication and will call to f/up .

## 2017-10-12 ENCOUNTER — Other Ambulatory Visit: Payer: Self-pay

## 2017-10-12 ENCOUNTER — Encounter (HOSPITAL_COMMUNITY): Payer: Self-pay

## 2017-10-12 ENCOUNTER — Emergency Department (HOSPITAL_COMMUNITY)
Admission: EM | Admit: 2017-10-12 | Discharge: 2017-10-12 | Disposition: A | Discharge status: Home / Self Care | Payer: No Typology Code available for payment source | Attending: Emergency Medicine | Admitting: Emergency Medicine

## 2017-10-12 ENCOUNTER — Emergency Department (HOSPITAL_COMMUNITY): Payer: No Typology Code available for payment source

## 2017-10-12 DIAGNOSIS — Z79899 Other long term (current) drug therapy: Secondary | ICD-10-CM | POA: Diagnosis not present

## 2017-10-12 DIAGNOSIS — M542 Cervicalgia: Secondary | ICD-10-CM | POA: Insufficient documentation

## 2017-10-12 DIAGNOSIS — M546 Pain in thoracic spine: Secondary | ICD-10-CM

## 2017-10-12 DIAGNOSIS — I10 Essential (primary) hypertension: Secondary | ICD-10-CM | POA: Insufficient documentation

## 2017-10-12 MED ORDER — CYCLOBENZAPRINE HCL 10 MG PO TABS: 10.0000 mg | ORAL_TABLET | Freq: Two times a day (BID) | ORAL | 0 refills | Status: DC | PRN

## 2017-10-12 MED ORDER — ACETAMINOPHEN 500 MG PO TABS
1000.0000 mg | ORAL_TABLET | Freq: Once | ORAL | Status: AC
  Administered 2017-10-12: 1000 mg via ORAL
  Filled 2017-10-12: qty 2

## 2017-10-12 MED ORDER — ONDANSETRON 4 MG PO TBDP
4.0000 mg | ORAL_TABLET | Freq: Once | ORAL | Status: DC
  Filled 2017-10-12: qty 1

## 2017-10-12 NOTE — Discharge Instructions (Signed)
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
## 2018-03-31 ENCOUNTER — Encounter: Payer: Self-pay | Admitting: Family Medicine

## 2018-03-31 ENCOUNTER — Ambulatory Visit: Payer: Self-pay | Attending: Family Medicine | Admitting: Family Medicine

## 2018-03-31 ENCOUNTER — Other Ambulatory Visit: Payer: Self-pay

## 2018-03-31 VITALS — BP 122/86 | HR 86 | Temp 98.2°F | Resp 18 | Ht 59.0 in | Wt 189.0 lb

## 2018-03-31 DIAGNOSIS — Z9049 Acquired absence of other specified parts of digestive tract: Secondary | ICD-10-CM | POA: Insufficient documentation

## 2018-03-31 DIAGNOSIS — Z09 Encounter for follow-up examination after completed treatment for conditions other than malignant neoplasm: Secondary | ICD-10-CM | POA: Insufficient documentation

## 2018-03-31 DIAGNOSIS — M542 Cervicalgia: Secondary | ICD-10-CM | POA: Insufficient documentation

## 2018-03-31 DIAGNOSIS — Z1239 Encounter for other screening for malignant neoplasm of breast: Secondary | ICD-10-CM

## 2018-03-31 DIAGNOSIS — Z90710 Acquired absence of both cervix and uterus: Secondary | ICD-10-CM | POA: Insufficient documentation

## 2018-03-31 DIAGNOSIS — D649 Anemia, unspecified: Secondary | ICD-10-CM | POA: Insufficient documentation

## 2018-03-31 DIAGNOSIS — R0789 Other chest pain: Secondary | ICD-10-CM

## 2018-03-31 DIAGNOSIS — M25512 Pain in left shoulder: Secondary | ICD-10-CM

## 2018-03-31 DIAGNOSIS — Z833 Family history of diabetes mellitus: Secondary | ICD-10-CM | POA: Insufficient documentation

## 2018-03-31 DIAGNOSIS — R5383 Other fatigue: Secondary | ICD-10-CM

## 2018-03-31 DIAGNOSIS — R51 Headache: Secondary | ICD-10-CM | POA: Insufficient documentation

## 2018-03-31 DIAGNOSIS — R110 Nausea: Secondary | ICD-10-CM

## 2018-03-31 DIAGNOSIS — K219 Gastro-esophageal reflux disease without esophagitis: Secondary | ICD-10-CM

## 2018-03-31 DIAGNOSIS — Y92410 Unspecified street and highway as the place of occurrence of the external cause: Secondary | ICD-10-CM | POA: Insufficient documentation

## 2018-03-31 DIAGNOSIS — Z8249 Family history of ischemic heart disease and other diseases of the circulatory system: Secondary | ICD-10-CM | POA: Insufficient documentation

## 2018-03-31 DIAGNOSIS — I10 Essential (primary) hypertension: Secondary | ICD-10-CM | POA: Insufficient documentation

## 2018-03-31 DIAGNOSIS — R7303 Prediabetes: Secondary | ICD-10-CM

## 2018-03-31 DIAGNOSIS — F329 Major depressive disorder, single episode, unspecified: Secondary | ICD-10-CM | POA: Insufficient documentation

## 2018-03-31 DIAGNOSIS — E785 Hyperlipidemia, unspecified: Secondary | ICD-10-CM

## 2018-03-31 LAB — POCT GLYCOSYLATED HEMOGLOBIN (HGB A1C): HbA1c, POC (prediabetic range): 5.9 % (ref 5.7–6.4)

## 2018-03-31 MED ORDER — METFORMIN HCL 500 MG PO TABS: ORAL_TABLET | ORAL | 6 refills | Status: DC

## 2018-03-31 MED ORDER — OMEPRAZOLE 20 MG PO CPDR: 20.0000 mg | DELAYED_RELEASE_CAPSULE | Freq: Two times a day (BID) | ORAL | 3 refills | Status: DC

## 2018-03-31 MED FILL — OMEPRAZOLE 20 MG CAP: 20 | 30 days supply | Qty: 60 | Fill #0
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
## 2018-04-01 LAB — COMPREHENSIVE METABOLIC PANEL WITH GFR
ALT: 20 IU/L (ref 0–32)
AST: 20 IU/L (ref 0–40)
Albumin/Globulin Ratio: 1.6 (ref 1.2–2.2)
Albumin: 4.2 g/dL (ref 3.5–5.5)
Alkaline Phosphatase: 97 IU/L (ref 39–117)
BUN/Creatinine Ratio: 14 (ref 9–23)
BUN: 10 mg/dL (ref 6–24)
Bilirubin Total: <0.2 mg/dL (ref 0.0–1.2)
CO2: 24 mmol/L (ref 20–29)
Calcium: 9 mg/dL (ref 8.7–10.2)
Chloride: 101 mmol/L (ref 96–106)
Creatinine, Ser: 0.69 mg/dL (ref 0.57–1.00)
GFR calc Af Amer: 122 mL/min/1.73
GFR calc non Af Amer: 105 mL/min/1.73
Globulin, Total: 2.7 g/dL (ref 1.5–4.5)
Glucose: 91 mg/dL (ref 65–99)
Potassium: 4 mmol/L (ref 3.5–5.2)
Sodium: 139 mmol/L (ref 134–144)
Total Protein: 6.9 g/dL (ref 6.0–8.5)

## 2018-04-01 LAB — CBC WITH DIFFERENTIAL/PLATELET
Basophils Absolute: 0 x10E3/uL (ref 0.0–0.2)
Basos: 0 %
EOS (ABSOLUTE): 0.3 x10E3/uL (ref 0.0–0.4)
Eos: 3 %
Hematocrit: 37.2 % (ref 34.0–46.6)
Hemoglobin: 12.7 g/dL (ref 11.1–15.9)
Immature Grans (Abs): 0 x10E3/uL (ref 0.0–0.1)
Immature Granulocytes: 0 %
Lymphocytes Absolute: 2.6 x10E3/uL (ref 0.7–3.1)
Lymphs: 26 %
MCH: 27 pg (ref 26.6–33.0)
MCHC: 34.1 g/dL (ref 31.5–35.7)
MCV: 79 fL (ref 79–97)
Monocytes Absolute: 0.4 x10E3/uL (ref 0.1–0.9)
Monocytes: 4 %
Neutrophils Absolute: 6.5 x10E3/uL (ref 1.4–7.0)
Neutrophils: 67 %
Platelets: 376 x10E3/uL (ref 150–450)
RBC: 4.71 x10E6/uL (ref 3.77–5.28)
RDW: 13.8 % (ref 12.3–15.4)
WBC: 9.9 x10E3/uL (ref 3.4–10.8)

## 2018-04-01 MED FILL — metFORMIN HCL 500 MG TABS: 500 | 30 days supply | Qty: 30 | Fill #0

## 2018-04-02 ENCOUNTER — Telehealth: Payer: Self-pay | Admitting: *Deleted

## 2018-04-02 NOTE — Telephone Encounter (Signed)
Medical Assistant used [employer] to contact patient.  Interpreter Name: Berle Mull #: [id number] Patient verified DOB Patient is aware of mineral and blood count being normal. Patient may follow up as planned. No further questions.

## 2018-04-02 NOTE — Telephone Encounter (Signed)
-----   Message from Antony Blackbird, MD sent at 04/01/2018 11:58 AM EDT ----- Notify patient of normal CMP and normal CBC

## 2018-04-10 ENCOUNTER — Ambulatory Visit: Payer: Self-pay | Attending: Family Medicine

## 2018-04-11 ENCOUNTER — Ambulatory Visit: Payer: Self-pay

## 2018-04-28 ENCOUNTER — Ambulatory Visit: Payer: Self-pay | Admitting: Family Medicine

## 2018-05-08 ENCOUNTER — Encounter: Payer: Self-pay | Admitting: Family Medicine

## 2018-05-08 ENCOUNTER — Ambulatory Visit: Payer: Self-pay | Attending: Family Medicine | Admitting: Family Medicine

## 2018-05-08 VITALS — BP 126/80 | HR 80 | Temp 98.3°F | Ht 59.0 in | Wt 189.0 lb

## 2018-05-08 DIAGNOSIS — E785 Hyperlipidemia, unspecified: Secondary | ICD-10-CM

## 2018-05-08 DIAGNOSIS — Z90710 Acquired absence of both cervix and uterus: Secondary | ICD-10-CM | POA: Insufficient documentation

## 2018-05-08 DIAGNOSIS — G5602 Carpal tunnel syndrome, left upper limb: Secondary | ICD-10-CM

## 2018-05-08 DIAGNOSIS — Z833 Family history of diabetes mellitus: Secondary | ICD-10-CM | POA: Insufficient documentation

## 2018-05-08 DIAGNOSIS — Z8049 Family history of malignant neoplasm of other genital organs: Secondary | ICD-10-CM | POA: Insufficient documentation

## 2018-05-08 DIAGNOSIS — R200 Anesthesia of skin: Secondary | ICD-10-CM

## 2018-05-08 DIAGNOSIS — Z79899 Other long term (current) drug therapy: Secondary | ICD-10-CM

## 2018-05-08 DIAGNOSIS — M67912 Unspecified disorder of synovium and tendon, left shoulder: Secondary | ICD-10-CM

## 2018-05-08 DIAGNOSIS — Z9049 Acquired absence of other specified parts of digestive tract: Secondary | ICD-10-CM | POA: Insufficient documentation

## 2018-05-08 DIAGNOSIS — K219 Gastro-esophageal reflux disease without esophagitis: Secondary | ICD-10-CM

## 2018-05-08 DIAGNOSIS — M75102 Unspecified rotator cuff tear or rupture of left shoulder, not specified as traumatic: Secondary | ICD-10-CM | POA: Insufficient documentation

## 2018-05-08 DIAGNOSIS — M7712 Lateral epicondylitis, left elbow: Secondary | ICD-10-CM

## 2018-05-08 DIAGNOSIS — Z8249 Family history of ischemic heart disease and other diseases of the circulatory system: Secondary | ICD-10-CM | POA: Insufficient documentation

## 2018-05-08 DIAGNOSIS — R5383 Other fatigue: Secondary | ICD-10-CM

## 2018-05-08 DIAGNOSIS — R7303 Prediabetes: Secondary | ICD-10-CM

## 2018-05-08 DIAGNOSIS — Z7984 Long term (current) use of oral hypoglycemic drugs: Secondary | ICD-10-CM | POA: Insufficient documentation

## 2018-05-08 LAB — GLUCOSE, POCT (MANUAL RESULT ENTRY): POC Glucose: 104 mg/dL — AB (ref 70–99)

## 2018-05-08 NOTE — Patient Instructions (Signed)
Sndrome del tnel carpiano (Carpal Tunnel Syndrome) El sndrome del tnel carpiano es una afeccin que causa dolor en la mano y en el brazo. El tnel carpiano es un espacio estrecho ubicado en el lado palmar de la [place]. Los movimientos repetidos de la mueca o determinadas enfermedades pueden causar la hinchazn del tnel. Esta hinchazn puede comprimir el nervio principal de la mueca (nervio mediano). CUIDADOS EN EL HOGAR Si tiene una frula:  sela como se lo haya indicado el mdico. Qutesela solamente como se lo haya indicado el mdico.  Afloje la frula si los dedos: ? Se le adormecen y siente hormigueos. ? Se le ponen azulados y fros.  Mantenga la frula limpia y seca. Instrucciones generales  [employer] de venta libre y los recetados solamente como se lo haya indicado el mdico.  Haga reposar la [place] de toda actividad que le cause dolor. Si es necesario, hable con su empleador [employer] cambios que pueden hacerse en su lugar de [place], por ejemplo, usar una almohadilla para apoyar la mueca mientras tipea.  Si se lo indican, aplique hielo sobre la zona dolorida: ? Field seismologist hielo en una bolsa plstica. ? Coloque una [employer] piel y la bolsa de hielo. ? Coloque el hielo durante 57minutos, 2 a 3veces por da.  Concurra a todas las visitas de control como se lo haya indicado el mdico. Esto es importante.  Haga los ejercicios como se lo hayan indicado el mdico, el fisioterapeuta o el terapeuta ocupacional. SOLICITE AYUDA SI:  Aparecen nuevos sntomas.  Los medicamentos no Forensic psychologist.  Los sntomas empeoran. Esta informacin no tiene Marine scientist el consejo del mdico. Asegrese de hacerle al mdico cualquier pregunta que tenga. Document Released: 05/24/2011 Document Revised: 02/23/2015 Document Reviewed: 10/20/2014 Elsevier Interactive Patient Education  2018 [employer].

## 2018-05-08 NOTE — Progress Notes (Signed)
04Pt. Is here to follow-up on nauseous. Pt. States her nauseous is getting better. Pt. Stated her left arm tend to get numb more.   Pt. Is fasting for labs.
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
## 2018-05-09 ENCOUNTER — Telehealth (INDEPENDENT_AMBULATORY_CARE_PROVIDER_SITE_OTHER): Payer: Self-pay

## 2018-05-09 ENCOUNTER — Other Ambulatory Visit: Payer: Self-pay | Admitting: Family Medicine

## 2018-05-09 DIAGNOSIS — E559 Vitamin D deficiency, unspecified: Secondary | ICD-10-CM

## 2018-05-09 DIAGNOSIS — Z79899 Other long term (current) drug therapy: Secondary | ICD-10-CM

## 2018-05-09 DIAGNOSIS — E785 Hyperlipidemia, unspecified: Secondary | ICD-10-CM

## 2018-05-09 LAB — LIPID PANEL
Chol/HDL Ratio: 5.9 ratio — ABNORMAL HIGH (ref 0.0–4.4)
Cholesterol, Total: 264 mg/dL — ABNORMAL HIGH (ref 100–199)
HDL: 45 mg/dL
LDL Calculated: 182 mg/dL — ABNORMAL HIGH (ref 0–99)
Triglycerides: 187 mg/dL — ABNORMAL HIGH (ref 0–149)
VLDL Cholesterol Cal: 37 mg/dL (ref 5–40)

## 2018-05-09 LAB — VITAMIN D 25 HYDROXY (VIT D DEFICIENCY, FRACTURES): Vit D, 25-Hydroxy: 8.8 ng/mL — ABNORMAL LOW (ref 30.0–100.0)

## 2018-05-09 LAB — VITAMIN B12: Vitamin B-12: 508 pg/mL (ref 232–1245)

## 2018-05-09 MED ORDER — VITAMIN D (ERGOCALCIFEROL) 1.25 MG (50000 UNIT) PO CAPS: 50000.0000 [IU] | ORAL_CAPSULE | ORAL | 4 refills | Status: DC

## 2018-05-09 MED ORDER — ROSUVASTATIN CALCIUM 10 MG PO TABS: 10.0000 mg | ORAL_TABLET | Freq: Every day | ORAL | 6 refills | Status: DC

## 2018-05-09 MED FILL — ROSUVASTATIN CALCIUM 10 MG: 10 | 30 days supply | Qty: 30 | Fill #0

## 2018-05-09 MED FILL — VIT D2 1.25 MG (50,000 UNIT: 1.25 MG | 12 days supply | Qty: 12 | Fill #0

## 2018-05-09 NOTE — Telephone Encounter (Signed)
Call placed using pacific interpreter [phone]) left voicemail per DPR notifying patientthat her bad cholesterol level was very high at 182 and that I would like for her to start a medication to help lower her cholesterol. I will send a prescription into her pharmacy and she should return for a lab visit in about 6 weeks to have her liver enzymes rechecked after starting the medication. Also her vitamin D level is really low at 8.8 and a prescription will be sent in for Vitamin D to take 50,000 IU's once daily for 12 weeks and since your level was so low you may also want to but otc Vitamin D to take at least 1,000 IU's daily as well. Call [facility] at [phone] with any questions or concerns. Nat Christen, CMA

## 2018-05-09 NOTE — Progress Notes (Signed)
Patient with recent labs with a high LDL and low Vitamin D and prescriptions will be sent into her pharmacy. Patient will be notified by CMA and will also need lab visit for repeat LFT's about 4-6 weeks after starting cholesterol medication.

## 2018-05-09 NOTE — Telephone Encounter (Signed)
-----   Message from Antony Blackbird, MD sent at 05/09/2018  2:05 PM EST ----- Please let patient know that her bad cholesterol level was very high at 182 and that I would like for her to start a medication to help lower her cholesterol. I will send a prescription into her pharmacy and she should return for a lab visit in about 6 weeks to have her liver enzymes rechecked after starting the medication. Also her vitamin D level is really low at 8.8 and a prescription will be sent in for Vitamin D to take 50,000 IU's once daily for 12 weeks and since your level was so low you may also want to but otc Vitamin D to take at least 1,000 IU's daily as well

## 2018-05-20 ENCOUNTER — Ambulatory Visit (INDEPENDENT_AMBULATORY_CARE_PROVIDER_SITE_OTHER): Payer: Self-pay | Admitting: Orthopaedic Surgery

## 2018-05-20 ENCOUNTER — Encounter (INDEPENDENT_AMBULATORY_CARE_PROVIDER_SITE_OTHER): Payer: Self-pay

## 2018-06-20 ENCOUNTER — Other Ambulatory Visit: Payer: Self-pay

## 2018-06-24 ENCOUNTER — Telehealth: Payer: Self-pay | Admitting: Family Medicine

## 2018-06-24 ENCOUNTER — Ambulatory Visit: Payer: Self-pay | Attending: Family Medicine

## 2018-06-24 DIAGNOSIS — E785 Hyperlipidemia, unspecified: Secondary | ICD-10-CM | POA: Insufficient documentation

## 2018-06-24 DIAGNOSIS — Z79899 Other long term (current) drug therapy: Secondary | ICD-10-CM | POA: Insufficient documentation

## 2018-06-24 NOTE — Progress Notes (Signed)
Patient here for lab visit only 

## 2018-06-24 NOTE — Telephone Encounter (Signed)
LVM to Pt informed her that she only got 50% discount from [facility] and am sending the bill back with a copy of the [place] letter

## 2018-06-25 LAB — HEPATIC FUNCTION PANEL
ALT: 20 IU/L (ref 0–32)
AST: 17 IU/L (ref 0–40)
Albumin: 4.4 g/dL (ref 3.5–5.5)
Alkaline Phosphatase: 97 IU/L (ref 39–117)
Bilirubin Total: 0.3 mg/dL (ref 0.0–1.2)
Bilirubin, Direct: 0.1 mg/dL (ref 0.00–0.40)
Total Protein: 7 g/dL (ref 6.0–8.5)

## 2018-07-09 ENCOUNTER — Telehealth: Payer: Self-pay | Admitting: *Deleted

## 2018-07-09 NOTE — Telephone Encounter (Signed)
Medical Assistant used [employer] to contact patient.  Interpreter Name: Daiva Nakayama #: [id number] Patient was not available, [employer] left patient a voicemail. Patient is aware of cholesterol being normal. Voicemail states to give a call back to [place] with [facility] at [phone].

## 2018-07-09 NOTE — Telephone Encounter (Signed)
-----   Message from Antony Blackbird, MD sent at 06/25/2018  8:54 AM EST ----- Please notify patient of normal lipid panel

## 2018-07-18 ENCOUNTER — Encounter (HOSPITAL_COMMUNITY): Payer: Self-pay | Admitting: Emergency Medicine

## 2018-07-18 ENCOUNTER — Ambulatory Visit (HOSPITAL_COMMUNITY)
Admission: EM | Admit: 2018-07-18 | Discharge: 2018-07-18 | Disposition: A | Discharge status: Home / Self Care | Payer: Self-pay | Attending: Internal Medicine | Admitting: Internal Medicine

## 2018-07-18 DIAGNOSIS — N3091 Cystitis, unspecified with hematuria: Secondary | ICD-10-CM

## 2018-07-18 LAB — POCT URINALYSIS DIP (DEVICE)
Bilirubin Urine: NEGATIVE
Glucose, UA: NEGATIVE mg/dL
KETONES UR: NEGATIVE mg/dL
Nitrite: NEGATIVE
Protein, ur: 100 mg/dL — AB
Specific Gravity, Urine: 1.015 (ref 1.005–1.030)
Urobilinogen, UA: 0.2 mg/dL (ref 0.0–1.0)
pH: 7 (ref 5.0–8.0)

## 2018-07-18 MED ORDER — PHENAZOPYRIDINE HCL 200 MG PO TABS: 200.0000 mg | ORAL_TABLET | Freq: Three times a day (TID) | ORAL | 0 refills | Status: DC

## 2018-07-18 MED ORDER — IBUPROFEN 800 MG PO TABS: 800.0000 mg | ORAL_TABLET | Freq: Once | ORAL | Status: DC

## 2018-07-18 MED ORDER — CIPROFLOXACIN HCL 500 MG PO TABS: 500.0000 mg | ORAL_TABLET | Freq: Two times a day (BID) | ORAL | 0 refills | Status: DC

## 2018-07-18 MED ORDER — IBUPROFEN 800 MG PO TABS
ORAL_TABLET | ORAL | Status: AC
  Filled 2018-07-18: qty 1

## 2018-07-18 NOTE — ED Triage Notes (Signed)
With spanish interpreter, pt states "yesterday i've been feeling burning in my lower abdomen, whenever I urinate also a burning sensation and in the toilet and when I clean myself I noticed blood."

## 2018-07-18 NOTE — Discharge Instructions (Signed)
LAS PASTILLAS CAFES ES PARA EL DOLOR CAMBIA EL COLOR DEL ORIN A ANARANJADO, ESO ES NORMAL. ELC CIPRO ES ANTIBIOTICO  MANDAREMOS EL ORIN PARA CULTURA Y REGRESA EN 2 DIAS

## 2018-07-18 NOTE — ED Provider Notes (Signed)
[place]    CSN: [id number] Arrival date & time: 07/18/18  1411     History   Chief Complaint Chief Complaint  Patient presents with  . Dysuria  . Hematuria    HPI Aleshka Corney is a 48 y.o. female.   Onset of dysuria and burning since yesterday. Today felt pain on R mid abd and has been chilling. Has hysterectomy 2012.  Has hx of bartholin gland cyst but better with vinegar rinses, and the last time she was doing this was 2 days ago, and wonders if this could be from this rinses.  She has also been having urgency and frequency. No flank pain or fever.     Past Medical History:  Diagnosis Date  . Anemia   . Depression   . High cholesterol   . Hypertension    no med, patient denies  . Menorrhagia   . Shortness of breath dyspnea    with exertion    Patient Active Problem List   Diagnosis Date Noted  . Prediabetes 10/29/2016  . Gastroesophageal reflux disease without esophagitis 10/29/2016  . Palpitations 02/14/2016  . Atypical chest pain 02/14/2016  . Post-operative state 11/29/2015  . Menorrhagia with regular cycle 07/28/2015  . 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 Anxiety 05/21/2014  . Morbid obesity ([place]) 05/21/2014  . Dysmenorrhea 05/21/2014    Past Surgical History:  Procedure Laterality Date  . BLADDER SURGERY    . CHOLECYSTECTOMY    . LAPAROSCOPIC VAGINAL HYSTERECTOMY WITH SALPINGECTOMY Bilateral 11/29/2015   Procedure: LAPAROSCOPIC ASSISTED VAGINAL HYSTERECTOMY WITH SALPINGECTOMY;  Surgeon: Emily Filbert, MD;  Location: [facility];  Service: Gynecology;  Laterality: Bilateral;  . WISDOM TOOTH EXTRACTION      OB History    Gravida  5   Para  3   Term  3   Preterm  0   AB  2   Living  3     SAB  2   TAB  0   Ectopic  0   Multiple  0   Live Births               Home Medications    Prior to Admission medications   Medication Sig Start Date End Date Taking? Authorizing Provider  cyclobenzaprine  (FLEXERIL) 10 MG tablet Take 1 tablet (10 mg total) by mouth 2 (two) times daily as needed for muscle spasms. Patient not taking: Reported on 07/18/2018 10/12/17   Rodell Perna A, PA-C  ibuprofen (ADVIL,MOTRIN) 200 MG tablet Take 400 mg by mouth daily as needed for moderate pain.    [provider]  metFORMIN (GLUCOPHAGE) 500 MG tablet Take one pill once per day with a meal 03/31/18   Fulp, Cammie, MD  omeprazole (PRILOSEC) 20 MG capsule Take 1 capsule (20 mg total) by mouth 2 (two) times daily. To lower stomach acid Patient not taking: Reported on 05/08/2018 03/31/18   Fulp, Ander Gaster, MD  rosuvastatin (CRESTOR) 10 MG tablet Take 1 tablet (10 mg total) by mouth daily. 05/09/18   Fulp, Cammie, MD  Vitamin D, Ergocalciferol, (DRISDOL) 1.25 MG (50000 UT) CAPS capsule Take 1 capsule (50,000 Units total) by mouth every 7 (seven) days. 05/09/18   Antony Blackbird, MD    Family History Family History  Problem Relation Age of Onset  . Diabetes Father   . Hyperlipidemia Father   . Hypertension Father   . Cancer Mother 75       uterine  . Diabetes Brother  Social History Social History   Tobacco Use  . Smoking status: Never Smoker  . Smokeless tobacco: Never Used  Substance Use Topics  . Alcohol use: No  . Drug use: No     Allergies   Patient has no known allergies.   Review of Systems Review of Systems  Constitutional: Negative for chills, diaphoresis and fever.  Gastrointestinal: Positive for abdominal pain. Negative for abdominal distention, constipation and nausea.  Genitourinary: Positive for dysuria, frequency, hematuria, urgency and vaginal bleeding. Negative for flank pain, genital sores, vaginal discharge and vaginal pain.  Skin: Negative for rash and wound.  Neurological: Negative for dizziness.  Hematological: Negative for adenopathy.     Physical Exam Triage Vital Signs ED Triage Vitals  Enc Vitals Group     BP 07/18/18 1620 (!) 143/76     Pulse Rate 07/18/18  1620 88     Resp 07/18/18 1620 18     Temp 07/18/18 1620 98.6 F (37 C)     Temp Source 07/18/18 1620 Temporal     SpO2 07/18/18 1620 100 %     Weight --      Height --      Head Circumference --      Peak Flow --      Pain Score 07/18/18 1622 4     Pain Loc --      Pain Edu? --      Excl. in [place]? --    No data found.  Updated Vital Signs BP (!) 143/76   Pulse 88   Temp 98.6 F (37 C) (Temporal)   Resp 18   LMP 11/12/2015 (Exact Date) Comment: spotting  SpO2 100%   Visual Acuity Right Eye Distance:   Left Eye Distance:   Bilateral Distance:    Right Eye Near:   Left Eye Near:    Bilateral Near:     Physical Exam Vitals signs and nursing note reviewed.  Constitutional:      General: She is not in acute distress.    Appearance: She is obese. She is not toxic-appearing.  HENT:     Head: Normocephalic.     Right Ear: External ear normal.     Left Ear: External ear normal.     Nose: Nose normal.  Eyes:     General: No scleral icterus.       Right eye: No discharge.        Left eye: No discharge.     Conjunctiva/sclera: Conjunctivae normal.  Neck:     Musculoskeletal: Neck supple.  Pulmonary:     Effort: Pulmonary effort is normal.  Abdominal:     General: Bowel sounds are normal. There is no distension.     Palpations: Abdomen is soft. There is no mass.     Tenderness: There is abdominal tenderness. There is no right CVA tenderness, left CVA tenderness, guarding or rebound.     Hernia: No hernia is present.     Comments: Has mild tenderness on suprapubic region  Musculoskeletal: Normal range of motion.  Skin:    General: Skin is warm and dry.  Neurological:     Mental Status: She is alert and oriented to person, place, and time.     Motor: No weakness.     Gait: Gait normal.  Psychiatric:        Mood and Affect: Mood normal.        Behavior: Behavior normal.        Thought Content: Thought  content normal.        Judgment: Judgment normal.    UC  Treatments / Results  Labs (all labs ordered are listed, but only abnormal results are displayed) Labs Reviewed - No data to display  EKG None  Radiology No results found.  Procedures Procedures   Medications Ordered in UC Medications - No data to display  Initial Impression / Assessment and Plan / UC Course  I have reviewed the triage vital signs and the nursing notes. Pertinent labs  results that were available during my care of the patient were reviewed by me and considered in my medical decision making (see chart for details). I sent her urine for a culture. She was placed on Pyridium and Cipro in the mean time.  Clinical Course as of Jul 18 1648  Fri Jul 18, 2018  1644 POCT Urinalysis, Dipstick [LM]    Clinical Course User Index [LM] Wynona Luna, MD   UA was positive for large blood, no nitrates or glucose, small leuks.  Final Clinical Impressions(s) / UC Diagnoses   Final diagnoses:  None   Discharge Instructions   None    ED Prescriptions    None     Controlled Substance Prescriptions [place] Controlled Substance Registry consulted?    Shelby Mattocks, [place] 07/18/18 1853

## 2018-07-18 NOTE — ED Notes (Signed)
Patient able to ambulate independently  

## 2019-01-24 ENCOUNTER — Ambulatory Visit (HOSPITAL_COMMUNITY)
Admission: EM | Admit: 2019-01-24 | Discharge: 2019-01-24 | Disposition: A | Discharge status: Home / Self Care | Payer: Self-pay | Attending: Family Medicine | Admitting: Family Medicine

## 2019-01-24 ENCOUNTER — Other Ambulatory Visit: Payer: Self-pay

## 2019-01-24 ENCOUNTER — Encounter (HOSPITAL_COMMUNITY): Payer: Self-pay | Admitting: *Deleted

## 2019-01-24 DIAGNOSIS — Z7984 Long term (current) use of oral hypoglycemic drugs: Secondary | ICD-10-CM | POA: Insufficient documentation

## 2019-01-24 DIAGNOSIS — R002 Palpitations: Secondary | ICD-10-CM | POA: Insufficient documentation

## 2019-01-24 DIAGNOSIS — Z8249 Family history of ischemic heart disease and other diseases of the circulatory system: Secondary | ICD-10-CM | POA: Insufficient documentation

## 2019-01-24 DIAGNOSIS — Z833 Family history of diabetes mellitus: Secondary | ICD-10-CM | POA: Insufficient documentation

## 2019-01-24 DIAGNOSIS — I10 Essential (primary) hypertension: Secondary | ICD-10-CM | POA: Insufficient documentation

## 2019-01-24 DIAGNOSIS — E7800 Pure hypercholesterolemia, unspecified: Secondary | ICD-10-CM | POA: Insufficient documentation

## 2019-01-24 DIAGNOSIS — Z8049 Family history of malignant neoplasm of other genital organs: Secondary | ICD-10-CM | POA: Insufficient documentation

## 2019-01-24 DIAGNOSIS — Z79899 Other long term (current) drug therapy: Secondary | ICD-10-CM | POA: Insufficient documentation

## 2019-01-24 DIAGNOSIS — R519 Headache, unspecified: Secondary | ICD-10-CM

## 2019-01-24 DIAGNOSIS — Z8349 Family history of other endocrine, nutritional and metabolic diseases: Secondary | ICD-10-CM | POA: Insufficient documentation

## 2019-01-24 DIAGNOSIS — R7303 Prediabetes: Secondary | ICD-10-CM | POA: Insufficient documentation

## 2019-01-24 DIAGNOSIS — R42 Dizziness and giddiness: Secondary | ICD-10-CM | POA: Insufficient documentation

## 2019-01-24 DIAGNOSIS — K219 Gastro-esophageal reflux disease without esophagitis: Secondary | ICD-10-CM | POA: Insufficient documentation

## 2019-01-24 DIAGNOSIS — Z20828 Contact with and (suspected) exposure to other viral communicable diseases: Secondary | ICD-10-CM | POA: Insufficient documentation

## 2019-01-24 DIAGNOSIS — F419 Anxiety disorder, unspecified: Secondary | ICD-10-CM | POA: Insufficient documentation

## 2019-01-24 DIAGNOSIS — R51 Headache: Secondary | ICD-10-CM | POA: Insufficient documentation

## 2019-01-24 LAB — CBC WITH DIFFERENTIAL/PLATELET
Abs Immature Granulocytes: 0.03 10*3/uL (ref 0.00–0.07)
Basophils Absolute: 0.1 10*3/uL (ref 0.0–0.1)
Basophils Relative: 1 %
Eosinophils Absolute: 0.2 10*3/uL (ref 0.0–0.5)
Eosinophils Relative: 2 %
HCT: 40.2 % (ref 36.0–46.0)
Hemoglobin: 13.1 g/dL (ref 12.0–15.0)
Immature Granulocytes: 0 %
Lymphocytes Relative: 24 %
Lymphs Abs: 2.4 10*3/uL (ref 0.7–4.0)
MCH: 27 pg (ref 26.0–34.0)
MCHC: 32.6 g/dL (ref 30.0–36.0)
MCV: 82.7 fL (ref 80.0–100.0)
Monocytes Absolute: 0.5 10*3/uL (ref 0.1–1.0)
Monocytes Relative: 5 %
Neutro Abs: 6.9 10*3/uL (ref 1.7–7.7)
Neutrophils Relative %: 68 %
Platelets: 356 10*3/uL (ref 150–400)
RBC: 4.86 MIL/uL (ref 3.87–5.11)
RDW: 13.7 % (ref 11.5–15.5)
WBC: 10.1 10*3/uL (ref 4.0–10.5)
nRBC: 0 % (ref 0.0–0.2)

## 2019-01-24 LAB — BASIC METABOLIC PANEL
Anion gap: 10 (ref 5–15)
BUN: 11 mg/dL (ref 6–20)
CO2: 26 mmol/L (ref 22–32)
Calcium: 9.8 mg/dL (ref 8.9–10.3)
Chloride: 101 mmol/L (ref 98–111)
Creatinine, Ser: 0.66 mg/dL (ref 0.44–1.00)
GFR calc Af Amer: >60 mL/min (ref >60)
GFR calc non Af Amer: >60 mL/min (ref >60)
Glucose, Bld: 93 mg/dL (ref 70–99)
Potassium: 3.8 mmol/L (ref 3.5–5.1)
Sodium: 137 mmol/L (ref 135–145)

## 2019-01-24 LAB — TSH: TSH: 2.456 u[IU]/mL (ref 0.350–4.500)

## 2019-01-24 LAB — GLUCOSE, CAPILLARY: Glucose-Capillary: 92 mg/dL (ref 70–99)

## 2019-01-24 MED ORDER — OMEPRAZOLE 20 MG PO CPDR: 20.0000 mg | DELAYED_RELEASE_CAPSULE | Freq: Every day | ORAL | 0 refills | Status: DC

## 2019-01-24 MED ORDER — IBUPROFEN 800 MG PO TABS
ORAL_TABLET | ORAL | Status: AC
  Filled 2019-01-24: qty 1

## 2019-01-24 MED ORDER — IBUPROFEN 800 MG PO TABS
800.0000 mg | ORAL_TABLET | Freq: Once | ORAL | Status: AC
  Administered 2019-01-24: 800 mg via ORAL

## 2019-01-24 MED ORDER — HYDROXYZINE HCL 25 MG PO TABS: 25.0000 mg | ORAL_TABLET | Freq: Three times a day (TID) | ORAL | 0 refills | Status: DC | PRN

## 2019-01-24 NOTE — ED Provider Notes (Signed)
[place]    CSN: [id number] Arrival date & time: 01/24/19  1025     History   Chief Complaint Chief Complaint  Patient presents with   Generalized Body Aches   Headache    HPI Darlene Salazar is a 48 y.o. female.   Darlene Salazar presents with complaints of weakness, headache's, feels like her heart is racing, poor sleep, dizziness. Started 8/3. Worsened over the past few days in that she has had harder time sleeping. Progressive symptoms. Started out as headache and hard time sleeping. Dizziness comes and goes. No specific known trigger. Feels dizzy now. No current headache. Doesn't check her blood sugars regularly. Has been able to eat and drink normally. Has been eating less due to decreased appetite. No loss of taste or smell. Nausea, no vomiting or diarrhea. Nausea with shakiness sensation, tends to occur mid day. No cough, no sore throat, no congestion. No ear pain. Has had similar in the past when she has had elevated blood pressure. However her symptoms have been more constant than previous episodes. No fall or head injury. advil for headache, does help sometimes with headache. Taken last two days ago. Feels shortness of breath , no chest pain . No known ill contacts. Not currently working, her husband works as a Dealer, he has been well. Past two nights feels sharp stabbing pains to left chest which improves when she rubs the chest wall, then her left arm feels numb. Past two nights her body "wants to go numb", including face and lips. Denies  Any previous episodes similar to these. She is followed at [facility]. Per chart review has been evaluated for similar symptoms at various times over the past years. History  Of depression, htn, dyspnea, gerd, prediabetes, palpitations, atypical chest pain.    Daughter assists with spanish interpretation per patient request.   ROS per HPI, negative if not otherwise  mentioned.      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   Anxiety 05/21/2014   Morbid obesity ([place]) 05/21/2014   Dysmenorrhea 05/21/2014    Past Surgical History:  Procedure Laterality Date   ABDOMINAL HYSTERECTOMY     BLADDER SURGERY     CHOLECYSTECTOMY     LAPAROSCOPIC VAGINAL HYSTERECTOMY WITH SALPINGECTOMY Bilateral 11/29/2015   Procedure: LAPAROSCOPIC ASSISTED VAGINAL HYSTERECTOMY WITH SALPINGECTOMY;  Surgeon: Emily Filbert, MD;  Location: [facility];  Service: Gynecology;  Laterality: Bilateral;   WISDOM TOOTH EXTRACTION      OB History    Gravida  5   Para  3   Term  3   Preterm  0   AB  2   Living  3     SAB  2   TAB  0   Ectopic  0   Multiple  0   Live Births               Home Medications    Prior to Admission medications  Medication Sig Start Date End Date Taking? Authorizing Provider  metFORMIN (GLUCOPHAGE) 500 MG tablet Take one pill once per day with a meal 03/31/18  Yes Fulp, Cammie, MD  hydrOXYzine (ATARAX/VISTARIL) 25 MG tablet Take 1 tablet (25 mg total) by mouth every 8 (eight) hours as needed for anxiety. 01/24/19   Zigmund Gottron, NP  ibuprofen (ADVIL,MOTRIN) 200 MG tablet Take 400 mg by mouth daily as needed for moderate pain.    [provider]  omeprazole (PRILOSEC) 20 MG capsule Take 1 capsule (20 mg total) by mouth daily. 01/24/19   Zigmund Gottron, NP  rosuvastatin (CRESTOR) 10 MG tablet Take 1 tablet (10 mg total) by mouth  daily. 05/09/18 01/24/19  Antony Blackbird, MD    Family History Family History  Problem Relation Age of Onset   Diabetes Father    Hyperlipidemia Father    Hypertension Father    Cancer Mother 38       uterine   Diabetes Brother     Social History Social History   Tobacco Use   Smoking status: Never Smoker   Smokeless tobacco: Never Used  Substance Use Topics   Alcohol use: No   Drug use: No     Allergies   Patient has no known allergies.   Review of Systems Review of Systems   Physical Exam Triage Vital Signs ED Triage Vitals [01/24/19 1057]  Enc Vitals Group     BP 134/85     Pulse Rate 88     Resp 16     Temp 98.2 F (36.8 C)     Temp Source Other     SpO2 100 %     Weight      Height      Head Circumference      Peak Flow      Pain Score 3     Pain Loc      Pain Edu?      Excl. in [place]?    Orthostatic VS for the past 24 hrs:  BP- Lying Pulse- Lying BP- Sitting Pulse- Sitting BP- Standing at 0 minutes Pulse- Standing at 0 minutes  01/24/19 1228 130/69 69 116/69 78 117/68 81    Updated Vital Signs BP 134/85    Pulse 88    Temp 98.2 F (36.8 C) (Other (Comment))    Resp 16    LMP 11/12/2015 (Exact Date) Comment: spotting   SpO2 100%    Physical Exam Constitutional:      General: She is not in acute distress.    Appearance: She is well-developed.  HENT:     Head: Normocephalic.     Mouth/Throat:     Mouth: Mucous membranes are moist.  Eyes:     Extraocular Movements: Extraocular movements intact.     Pupils: Pupils are equal, round, and reactive to light.  Neck:     Musculoskeletal: Normal range of motion.  Cardiovascular:     Rate and Rhythm: Normal rate and regular rhythm.     Heart sounds: Normal heart sounds.  Pulmonary:     Effort: Pulmonary effort is normal.  Skin:    General: Skin is warm and dry.  Neurological:     Mental Status: She is alert and oriented to person, place, and time. Mental status is at baseline.     GCS:  GCS eye subscore is 4. GCS verbal subscore is 5. GCS motor subscore is 6.     Cranial Nerves: No cranial nerve deficit.  Sensory: No sensory deficit.     Comments: Ambulatory without difficulty or apparent weakness or abnormal gait  Psychiatric:        Mood and Affect: Mood normal.        Speech: Speech normal.    EKG:  NSR 79 . Previous EKG was available for review. No stwave changes as interpreted by me.    UC Treatments / Results  Labs (all labs ordered are listed, but only abnormal results are displayed) Labs Reviewed  NOVEL CORONAVIRUS, NAA (HOSPITAL ORDER, SEND-OUT TO REF LAB)  GLUCOSE, CAPILLARY  CBC WITH DIFFERENTIAL/PLATELET  BASIC METABOLIC PANEL  TSH  CBG MONITORING, ED    EKG   Radiology No results found.  Procedures Procedures (including critical care time)  Medications Ordered in UC Medications  ibuprofen (ADVIL) tablet 800 mg (800 mg Oral Given 01/24/19 1227)  ibuprofen (ADVIL) 800 MG tablet (has no administration in time range)  ibuprofen (ADVIL) 800 MG tablet (has no administration in time range)    Initial Impression / Assessment and Plan / UC Course  I have reviewed the triage vital signs and the nursing notes.  Pertinent labs & imaging results that were available during my care of the patient were reviewed by me and considered in my medical decision making (see chart for details).     No red flag findings on exam today. Labs, tsh collected without acute findings. Noted some drop in BP with orthostatics, encouraged to increase PO intake. Per nursing patient told her that she just restarted her metformin yesterday, so question if her blood sugar variation has been causing some symptoms. Did encourage her to monitor her blood sugar more regularly. Patient and daughter endorses anxiety and nervousness, will see if hydroxyzine is helpful with night time symptoms. Omeprazole also restarted. Return precautions provided. Encouraged close follow up with PCP.  Patient and daughter verbalized understanding and agreeable to plan.  Ambulatory out of clinic without difficulty.    Final Clinical Impressions(s) / UC Diagnoses   Final diagnoses:  Dizziness  Palpitations  Anxiety  Acute nonintractable headache, unspecified headache type     Discharge Instructions     Please increase your fluid/water intake.  Please continue with your metformin as prescribed, I would recommend trying to monitor your blood sugar at least daily, fasting.  We will restart your omeprazole for daily use to see if this helps with your chest symptoms.  May also try using Hydroxyzine, primarily before bed, or every 8 hours as needed for anxiety, to see if this is helpful with symptoms.  Please call Monday to make a follow up appointment with your primary care provider.  If any worsening of symptoms- dizziness, loss of consciousness, chest pain , sweating, nausea, please go to the ER.     ED Prescriptions    Medication Sig Dispense Auth. Provider   hydrOXYzine (ATARAX/VISTARIL) 25 MG tablet Take 1 tablet (25 mg total) by mouth every 8 (eight) hours as needed for anxiety. 12 tablet Augusto Gamble B, NP   omeprazole (PRILOSEC) 20 MG capsule Take 1 capsule (20 mg total) by mouth daily. 30 capsule Zigmund Gottron, NP     Controlled Substance Prescriptions [place] Controlled Substance Registry consulted? Not Applicable   Zigmund Gottron, NP 01/25/19 [phone]

## 2019-01-24 NOTE — Discharge Instructions (Signed)
Please increase your fluid/water intake.  Please continue with your metformin as prescribed, I would recommend trying to monitor your blood sugar at least daily, fasting.  We will restart your omeprazole for daily use to see if this helps with your chest symptoms.  May also try using Hydroxyzine, primarily before bed, or every 8 hours as needed for anxiety, to see if this is helpful with symptoms.  Please call Monday to make a follow up appointment with your primary care provider.  If any worsening of symptoms- dizziness, loss of consciousness, chest pain , sweating, nausea, please go to the ER.

## 2019-01-24 NOTE — ED Notes (Signed)
EKG shown to Dr Joseph Art.

## 2019-01-24 NOTE — ED Triage Notes (Signed)
C/O HA, body aches, episodes of feeling "racing heart", weakness, difficulty sleeping, feeling shaky x 5 days.  Denies fevers.  Pt reports feeling heart racing at present; reassured her HR is normal at this time.

## 2019-01-25 LAB — NOVEL CORONAVIRUS, NAA (HOSP ORDER, SEND-OUT TO REF LAB; TAT 18-24 HRS): SARS-CoV-2, NAA: NOT DETECTED

## 2019-01-27 ENCOUNTER — Telehealth (HOSPITAL_COMMUNITY): Payer: Self-pay | Admitting: Emergency Medicine

## 2019-01-27 NOTE — Telephone Encounter (Signed)
Normal labs, family contacted and made aware, all questions answered.

## 2019-08-14 ENCOUNTER — Ambulatory Visit: Payer: Self-pay | Attending: Family | Admitting: Family

## 2019-08-14 ENCOUNTER — Encounter: Payer: Self-pay | Admitting: Family

## 2019-08-14 ENCOUNTER — Other Ambulatory Visit: Payer: Self-pay

## 2019-08-14 VITALS — Temp 97.7°F | Ht 59.0 in | Wt 203.0 lb

## 2019-08-14 DIAGNOSIS — R7303 Prediabetes: Secondary | ICD-10-CM

## 2019-08-14 DIAGNOSIS — M545 Low back pain, unspecified: Secondary | ICD-10-CM

## 2019-08-14 DIAGNOSIS — M25551 Pain in right hip: Secondary | ICD-10-CM

## 2019-08-14 LAB — GLUCOSE, POCT (MANUAL RESULT ENTRY): POC Glucose: 99 mg/dl (ref 70–99)

## 2019-08-14 LAB — POCT GLYCOSYLATED HEMOGLOBIN (HGB A1C): Hemoglobin A1C: 5.7 % — AB (ref 4.0–5.6)

## 2019-08-14 MED ORDER — NAPROXEN 500 MG PO TABS: 500.0000 mg | ORAL_TABLET | Freq: Two times a day (BID) | ORAL | 0 refills | Status: DC

## 2019-08-14 MED FILL — NAPROXEN 500 MG TABLET: 500 | 15 days supply | Qty: 30 | Fill #0
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
## 2019-08-14 NOTE — Progress Notes (Signed)
Discussed during today's office visit the importance of a low-carb and low-sugar diet, at least 150 mins/week of moderate intensity exercise, and checking blood sugars at least twice daily. A1C almost at goal follow up with PCP in 3 months or sooner if needed.

## 2019-08-14 NOTE — Patient Instructions (Signed)
Naproxen for back and hip pain follow-up with attending physician in 2 weeks if symptoms do not resolve or become worse. Acute Back Pain, Adult Acute back pain is sudden and usually short-lived. It is often caused by an injury to the muscles and tissues in the back. The injury may result from:  A muscle or ligament getting overstretched or torn (strained). Ligaments are tissues that connect bones to each other. Lifting something improperly can cause a back strain.  Wear and tear (degeneration) of the spinal disks. Spinal disks are circular tissue that provides cushioning between the bones of the spine (vertebrae).  Twisting motions, such as while playing sports or doing yard work.  A hit to the back.  Arthritis. You may have a physical exam, lab tests, and imaging tests to find the cause of your pain. Acute back pain usually goes away with rest and home care. Follow these instructions at home: Managing pain, stiffness, and swelling  Take over-the-counter and prescription medicines only as told by your health care provider.  Your health care provider may recommend applying ice during the first 24-48 hours after your pain starts. To do this: ? Put ice in a plastic bag. ? Place a towel between your skin and the bag. ? Leave the ice on for 20 minutes, 2-3 times a day.  If directed, apply heat to the affected area as often as told by your health care provider. Use the heat source that your health care provider recommends, such as a moist heat pack or a heating pad. ? Place a towel between your skin and the heat source. ? Leave the heat on for 20-30 minutes. ? Remove the heat if your skin turns bright red. This is especially important if you are unable to feel pain, heat, or cold. You have a greater risk of getting burned. Activity   Do not stay in bed. Staying in bed for more than 1-2 days can delay your recovery.  Sit up and stand up straight. Avoid leaning forward when you sit, or  hunching over when you stand. ? If you work at a desk, sit close to it so you do not need to lean over. Keep your chin tucked in. Keep your neck drawn back, and keep your elbows bent at a right angle. Your arms should look like the letter "L." ? Sit high and close to the steering wheel when you drive. Add lower back (lumbar) support to your car seat, if needed.  Take short walks on even surfaces as soon as you are able. Try to increase the length of time you walk each day.  Do not sit, drive, or stand in one place for more than 30 minutes at a time. Sitting or standing for long periods of time can put stress on your back.  Do not drive or use heavy machinery while taking prescription pain medicine.  Use proper lifting techniques. When you bend and lift, use positions that put less stress on your back: ? [place] your knees. ? Keep the load close to your body. ? Avoid twisting.  Exercise regularly as told by your health care provider. Exercising helps your back heal faster and helps prevent back injuries by keeping muscles strong and flexible.  Work with a physical therapist to make a safe exercise program, as recommended by your health care provider. Do any exercises as told by your physical therapist. Lifestyle  Maintain a healthy weight. Extra weight puts stress on your back and makes it difficult  to have good posture.  Avoid activities or situations that make you feel anxious or stressed. Stress and anxiety increase muscle tension and can make back pain worse. Learn ways to manage anxiety and stress, such as through exercise. General instructions  Sleep on a firm mattress in a comfortable position. Try lying on your side with your knees slightly bent. If you lie on your back, put a pillow under your knees.  Follow your treatment plan as told by your health care provider. This may include: ? Cognitive or behavioral therapy. ? Acupuncture or massage therapy. ? Meditation or yoga. Contact  a health care provider if:  You have pain that is not relieved with rest or medicine.  You have increasing pain going down into your legs or buttocks.  Your pain does not improve after 2 weeks.  You have pain at night.  You lose weight without trying.  You have a fever or chills. Get help right away if:  You develop new bowel or bladder control problems.  You have unusual weakness or numbness in your arms or legs.  You develop nausea or vomiting.  You develop abdominal pain.  You feel faint. Summary  Acute back pain is sudden and usually short-lived.  Use proper lifting techniques. When you bend and lift, use positions that put less stress on your back.  Take over-the-counter and prescription medicines and apply heat or ice as directed by your health care provider. This information is not intended to replace advice given to you by your health care provider. Make sure you discuss any questions you have with your health care provider. Document Revised: 09/23/2018 Document Reviewed: 01/16/2017 Elsevier Patient Education  [place].

## 2019-09-11 ENCOUNTER — Ambulatory Visit: Payer: Self-pay | Attending: Family Medicine | Admitting: Family Medicine

## 2019-09-11 ENCOUNTER — Encounter: Payer: Self-pay | Admitting: Family Medicine

## 2019-09-11 ENCOUNTER — Other Ambulatory Visit: Payer: Self-pay

## 2019-09-11 VITALS — BP 113/78 | HR 80 | Temp 98.2°F | Resp 16 | Wt 202.8 lb

## 2019-09-11 DIAGNOSIS — R42 Dizziness and giddiness: Secondary | ICD-10-CM

## 2019-09-11 DIAGNOSIS — R102 Pelvic and perineal pain unspecified side: Secondary | ICD-10-CM

## 2019-09-11 DIAGNOSIS — N644 Mastodynia: Secondary | ICD-10-CM

## 2019-09-11 NOTE — Patient Instructions (Signed)
Please call the [facility] (breast and cervical cancer control program) at [phone] to schedule diagnostic mammogram

## 2019-09-11 NOTE — Progress Notes (Signed)
Subjective:  Patient ID: Darlene Salazar, female    DOB: March 14, 1971  Age: 49 y.o. MRN: [id number]  CC: Gynecologic Exam  Due to language barrier, [employer] video interpretation system used at today's visit  HPI Darlene Salazar, 49 year old female, last seen in the office on 08/14/2019 by another provider due to complaints of acute onset of back pain/right hip pain and prediabetes.  She presents at today's visit wanting Pap smear/pelvic exam.  On review of chart, her last Pap smear was done in 2017 and was normal at that time.  She also reports that she has had a prior hysterectomy due to uterine fibroids/menorrhagia/dysfunctional uterine bleeding.  She reports that she has had some left pelvic discomfort off and on for about 12 months.  She also reports left-sided breast pain for about 2 months.  She does not recall when she last had a mammogram.  She denies any other issues such as vaginal pain/vaginal discharge and she has noticed no changes in skin of the breast, no nipple discharge and no palpable nodules.        She also reports that a few weeks ago, she was having elevated blood pressure and she went to [facility] but states that she could not be seen therefore she went to a private provider and was placed on medication for control of her blood pressure. (Per CMA, patient had lisinopril failed at her pharmacy).  She feels that she now has had some mild dizziness/lightheadedness at times.  She denies any cough or shortness of breath.  Her home blood pressures are now within normal.  She reports that she also had blood work done by the physician she saw including her cholesterol.  She cannot recall the name of the practice where she was seen or the name of the physician.  Past Medical History:  Diagnosis Date  . Anemia   . Depression   . High cholesterol   . Hypertension    no med, patient denies  . Menorrhagia   . Shortness of breath dyspnea    with exertion    Past Surgical  History:  Procedure Laterality Date  . ABDOMINAL HYSTERECTOMY    . BLADDER SURGERY    . CHOLECYSTECTOMY    . LAPAROSCOPIC VAGINAL HYSTERECTOMY WITH SALPINGECTOMY Bilateral 11/29/2015   Procedure: LAPAROSCOPIC ASSISTED VAGINAL HYSTERECTOMY WITH SALPINGECTOMY;  Surgeon: Emily Filbert, MD;  Location: [facility];  Service: Gynecology;  Laterality: Bilateral;  . WISDOM TOOTH EXTRACTION      Family History  Problem Relation Age of Onset  . Diabetes Father   . Hyperlipidemia Father   . Hypertension Father   . Cancer Mother 20       uterine  . Diabetes Brother     Social History   Tobacco Use  . Smoking status: Never Smoker  . Smokeless tobacco: Never Used  Substance Use Topics  . Alcohol use: No    ROS Review of Systems  Constitutional: Positive for fatigue. Negative for chills and fever.  HENT: Negative for sore throat and trouble swallowing.   Eyes: Negative for photophobia and visual disturbance.  Respiratory: Negative for cough and shortness of breath.   Cardiovascular: Negative for chest pain, palpitations and leg swelling.  Gastrointestinal: Negative for abdominal pain, blood in stool, constipation, diarrhea, nausea and vomiting.  Endocrine: Negative for cold intolerance, heat intolerance, polydipsia, polyphagia and polyuria.  Genitourinary: Positive for pelvic pain. Negative for dysuria, frequency, vaginal bleeding, vaginal discharge and vaginal pain.  Musculoskeletal: Negative for arthralgias  and back pain.  Skin: Negative for rash and wound.  Neurological: Positive for dizziness and light-headedness. Negative for headaches.  Hematological: Negative for adenopathy. Does not bruise/bleed easily.  Psychiatric/Behavioral: Negative for self-injury and suicidal ideas.    Objective:   Today's Vitals: BP 113/78   Pulse 80   Temp 98.2 F (36.8 C)   Resp 16   Wt 202 lb 12.8 oz (92 kg)   LMP 11/12/2015 (LMP Unknown) Comment: spotting  SpO2 97%   BMI 40.96 kg/m   Physical  Exam Vitals and nursing note reviewed. Exam conducted with a chaperone present (CMA present for exam).  Constitutional:      Appearance: Normal appearance.     Comments: Well-nourished well-developed overweight for height/obese older female in no acute distress wearing mask as per office COVID-19 protocol  Cardiovascular:     Rate and Rhythm: Normal rate and regular rhythm.  Pulmonary:     Effort: Pulmonary effort is normal.     Breath sounds: Normal breath sounds.  Chest:     Chest wall: No mass.     Breasts:        Right: Normal.        Left: Tenderness present. No skin change.    Abdominal:     Palpations: Abdomen is soft.     Tenderness: There is no abdominal tenderness. There is no right CVA tenderness, left CVA tenderness, guarding or rebound.     Comments: Patient with inferior left lower quadrant/pelvic discomfort to palpation, no rebound or guarding  Genitourinary:    Vagina: No vaginal discharge.     Comments: Normal appearance to the vaginal canal, no cervical cuff-status post hysterectomy.  No right adnexal tenderness or masses palpable.  Complains of discomfort with palpation of left adnexal area but no palpable abnormality/mass Musculoskeletal:        General: No tenderness or deformity.     Cervical back: Normal range of motion and neck supple. No tenderness.     Right lower leg: No edema.     Left lower leg: No edema.  Lymphadenopathy:     Cervical: No cervical adenopathy.  Skin:    General: Skin is warm and dry.  Neurological:     General: No focal deficit present.     Mental Status: She is alert and oriented to person, place, and time.  Psychiatric:        Mood and Affect: Mood normal.        Behavior: Behavior normal.     Assessment & Plan:  1. Adnexal pain On bimanual exam of the pelvis, she has complaint of left adnexal tenderness.  She will be scheduled for complete pelvic ultrasound. - US Pelvic Complete With Transvaginal; Future  2. Dizziness  She reports that she has had some recent dizziness and is status post visit with a physician at another office that she cannot recall at which time she was prescribed blood pressure medication.  Per CMA, patient who was recently filled a prescription for lisinopril from an outside pharmacy.  Blood pressure is normal at today's visit.  Will check CBC to look for anemia or blood disorder as well as BMP to look for electrolyte abnormality due to patient's complaint of dizziness. - CBC with Differential - Basic Metabolic Panel  3. Breast pain, left Patient with complaint of left breast tenderness and she will be scheduled for diagnostic mammogram. - MM Digital Diagnostic Bilat; Future  Outpatient Encounter Medications as of 09/11/2019  Medication Sig  .  hydrOXYzine (ATARAX/VISTARIL) 25 MG tablet Take 1 tablet (25 mg total) by mouth every 8 (eight) hours as needed for anxiety. (Patient not taking: Reported on 08/14/2019)  . ibuprofen (ADVIL,MOTRIN) 200 MG tablet Take 400 mg by mouth daily as needed for moderate pain.  . metFORMIN (GLUCOPHAGE) 500 MG tablet Take one pill once per day with a meal (Patient not taking: Reported on 08/14/2019)  . naproxen (NAPROSYN) 500 MG tablet Take 1 tablet (500 mg total) by mouth 2 (two) times daily with a meal.  . omeprazole (PRILOSEC) 20 MG capsule Take 1 capsule (20 mg total) by mouth daily. (Patient not taking: Reported on 08/14/2019)  . [DISCONTINUED] rosuvastatin (CRESTOR) 10 MG tablet Take 1 tablet (10 mg total) by mouth daily.   No facility-administered encounter medications on file as of 09/11/2019.    An After Visit Summary was printed and given to the patient.   Follow-up: Return in about 2 weeks (around 09/25/2019) for dizziness.    Antony Blackbird MD

## 2019-09-12 ENCOUNTER — Emergency Department (HOSPITAL_COMMUNITY): Payer: Self-pay

## 2019-09-12 ENCOUNTER — Other Ambulatory Visit: Payer: Self-pay

## 2019-09-12 ENCOUNTER — Encounter (HOSPITAL_COMMUNITY): Payer: Self-pay | Admitting: Emergency Medicine

## 2019-09-12 ENCOUNTER — Emergency Department (HOSPITAL_COMMUNITY)
Admission: EM | Admit: 2019-09-12 | Discharge: 2019-09-13 | Disposition: A | Discharge status: Home / Self Care | Payer: Self-pay | Attending: Emergency Medicine | Admitting: Emergency Medicine

## 2019-09-12 DIAGNOSIS — R0789 Other chest pain: Secondary | ICD-10-CM | POA: Insufficient documentation

## 2019-09-12 DIAGNOSIS — R0602 Shortness of breath: Secondary | ICD-10-CM | POA: Insufficient documentation

## 2019-09-12 DIAGNOSIS — I10 Essential (primary) hypertension: Secondary | ICD-10-CM | POA: Insufficient documentation

## 2019-09-12 LAB — CBC WITH DIFFERENTIAL/PLATELET
Basophils Absolute: 0 x10E3/uL (ref 0.0–0.2)
Basos: 1 %
EOS (ABSOLUTE): 0.2 x10E3/uL (ref 0.0–0.4)
Eos: 2 %
Hematocrit: 39.2 % (ref 34.0–46.6)
Hemoglobin: 13 g/dL (ref 11.1–15.9)
Immature Grans (Abs): 0 x10E3/uL (ref 0.0–0.1)
Immature Granulocytes: 0 %
Lymphocytes Absolute: 2 x10E3/uL (ref 0.7–3.1)
Lymphs: 29 %
MCH: 27.3 pg (ref 26.6–33.0)
MCHC: 33.2 g/dL (ref 31.5–35.7)
MCV: 82 fL (ref 79–97)
Monocytes Absolute: 0.4 x10E3/uL (ref 0.1–0.9)
Monocytes: 5 %
Neutrophils Absolute: 4.3 x10E3/uL (ref 1.4–7.0)
Neutrophils: 63 %
Platelets: 359 x10E3/uL (ref 150–450)
RBC: 4.76 x10E6/uL (ref 3.77–5.28)
RDW: 13.3 % (ref 11.7–15.4)
WBC: 6.9 x10E3/uL (ref 3.4–10.8)

## 2019-09-12 LAB — CBC
HCT: 38.3 % (ref 36.0–46.0)
Hemoglobin: 12.1 g/dL (ref 12.0–15.0)
MCH: 26.7 pg (ref 26.0–34.0)
MCHC: 31.6 g/dL (ref 30.0–36.0)
MCV: 84.5 fL (ref 80.0–100.0)
Platelets: 349 K/uL (ref 150–400)
RBC: 4.53 MIL/uL (ref 3.87–5.11)
RDW: 13.5 % (ref 11.5–15.5)
WBC: 11.7 K/uL — ABNORMAL HIGH (ref 4.0–10.5)
nRBC: 0 % (ref 0.0–0.2)

## 2019-09-12 LAB — BASIC METABOLIC PANEL WITH GFR
Anion gap: 12 (ref 5–15)
BUN/Creatinine Ratio: 17 (ref 9–23)
BUN: 12 mg/dL (ref 6–24)
BUN: 16 mg/dL (ref 6–20)
CO2: 25 mmol/L (ref 20–29)
CO2: 24 mmol/L (ref 22–32)
Calcium: 9.3 mg/dL (ref 8.7–10.2)
Calcium: 9 mg/dL (ref 8.9–10.3)
Chloride: 103 mmol/L (ref 96–106)
Chloride: 99 mmol/L (ref 98–111)
Creatinine, Ser: 0.72 mg/dL (ref 0.57–1.00)
Creatinine, Ser: 0.85 mg/dL (ref 0.44–1.00)
GFR calc Af Amer: 115 mL/min/1.73
GFR calc Af Amer: >60 mL/min (ref >60)
GFR calc non Af Amer: 100 mL/min/1.73
GFR calc non Af Amer: >60 mL/min (ref >60)
Glucose, Bld: 117 mg/dL — ABNORMAL HIGH (ref 70–99)
Glucose: 90 mg/dL (ref 65–99)
Potassium: 4.8 mmol/L (ref 3.5–5.2)
Potassium: 4 mmol/L (ref 3.5–5.1)
Sodium: 142 mmol/L (ref 134–144)
Sodium: 135 mmol/L (ref 135–145)

## 2019-09-12 LAB — I-STAT BETA HCG BLOOD, ED (MC, WL, AP ONLY): I-stat hCG, quantitative: <5 m[IU]/mL (ref <5)

## 2019-09-12 LAB — TROPONIN I (HIGH SENSITIVITY): Troponin I (High Sensitivity): <2 ng/L (ref <18)

## 2019-09-12 MED ORDER — SODIUM CHLORIDE 0.9% FLUSH: 3.0000 mL | Freq: Once | INTRAVENOUS | Status: DC

## 2019-09-12 NOTE — ED Triage Notes (Signed)
Pt states about 30 min ago she felt as if her heart was going to stop, became nauseated and shob. Pt ate shrimp about 1 hour ago, no hx of reaction to seafood.

## 2019-09-13 LAB — TROPONIN I (HIGH SENSITIVITY): Troponin I (High Sensitivity): 3 ng/L (ref <18)

## 2019-09-13 MED ORDER — FAMOTIDINE 40 MG PO TABS: 40.0000 mg | ORAL_TABLET | Freq: Every day | ORAL | 1 refills | Status: DC

## 2019-09-13 NOTE — ED Provider Notes (Signed)
[facility] EMERGENCY DEPARTMENT Provider Note   CSN: [id number] Arrival date & time: 09/12/19  2223     History Chief Complaint  Patient presents with  . Palpitations    Darlene Salazar is a 49 y.o. female.  The history is provided by the patient, medical records and a significant other. A language interpreter was used ([place]).   49 y.o. female with complaints/comments per nursing/medical assistant note, with all such history reviewed for accuracy and confirmed by myself.  The patient's relevant past medical, surgical and social history was reviewed in [place].  HPI Comments: Darlene Salazar is a 49 y.o. female with a prior medical history of hyperlipidemia, hypertension, prediabetes, GERD who presents to the Emergency Department complaining of an episode of chest tightness that occurred 4 hours ago.  Patient states that she ate shrimp for dinner and afterwards felt an episode of chest tightness which she describes as a squeezing/pressure and that "it felt like my heart was going to stop".  This lasted for about 10 minutes and spontaneously resolved.  She states it occurred while she was laughing with friends.  She reports some mild shortness of breath during the episode which has since resolved.  She denies any recent increase in caffeine usage or stress.  She actually notes that she quit drinking caffeine this week.  4 days ago she experienced an episode of nausea, vomiting, chills, weakness.  She was evaluated by her primary doctor and states that she was placed on 20 mg of lisinopril as well as a statin medication for hyperlipidemia though she cannot tell me which medication this is.  She is not a smoker. She denies fevers, URI symptoms, abdominal pain, nausea, vomiting, diarrhea, dysuria, syncope.    Past Medical History:  Diagnosis Date  . Anemia   . Depression   . High cholesterol   . Hypertension    no med, patient denies  . Menorrhagia   .  Shortness of breath dyspnea    with exertion    Patient Active Problem List   Diagnosis Date Noted  . Prediabetes 10/29/2016  . Gastroesophageal reflux disease without esophagitis 10/29/2016  . Palpitations 02/14/2016  . Atypical chest pain 02/14/2016  . Post-operative state 11/29/2015  . Menorrhagia with regular cycle 07/28/2015  . 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 Anxiety 05/21/2014  . Morbid obesity ([address]) 05/21/2014  . Dysmenorrhea 05/21/2014    Past Surgical History:  Procedure Laterality Date  . ABDOMINAL HYSTERECTOMY    . BLADDER SURGERY    . CHOLECYSTECTOMY    . LAPAROSCOPIC VAGINAL HYSTERECTOMY WITH SALPINGECTOMY Bilateral 11/29/2015   Procedure: LAPAROSCOPIC ASSISTED VAGINAL HYSTERECTOMY WITH SALPINGECTOMY;  Surgeon: Emily Filbert, MD;  Location: [facility];  Service: Gynecology;  Laterality: Bilateral;  . WISDOM TOOTH EXTRACTION       OB History    Gravida  5   Para  3   Term  3   Preterm  0   AB  2   Living  3     SAB  2   TAB  0   Ectopic  0   Multiple  0   Live Births              Family History  Problem Relation Age of Onset  . Diabetes Father   . Hyperlipidemia Father   . Hypertension Father   . Cancer Mother 8       uterine  . Diabetes Brother     Social History  Tobacco Use  . Smoking status: Never Smoker  . Smokeless tobacco: Never Used  Substance Use Topics  . Alcohol use: No  . Drug use: No    Home Medications Prior to Admission medications   Medication Sig Start Date End Date Taking? Authorizing Provider  hydrOXYzine (ATARAX/VISTARIL) 25 MG tablet Take 1 tablet (25 mg total) by mouth every 8 (eight) hours as needed for anxiety. Patient not taking: Reported on 08/14/2019 01/24/19   Augusto Gamble B, NP  ibuprofen (ADVIL,MOTRIN) 200 MG tablet Take 400 mg by mouth daily as needed for moderate pain.    [provider]  metFORMIN (GLUCOPHAGE) 500 MG tablet Take one pill once per day with a meal Patient not  taking: Reported on 08/14/2019 03/31/18   Fulp, Ander Gaster, MD  naproxen (NAPROSYN) 500 MG tablet Take 1 tablet (500 mg total) by mouth 2 (two) times daily with a meal. 08/14/19   Camillia Herter, NP  omeprazole (PRILOSEC) 20 MG capsule Take 1 capsule (20 mg total) by mouth daily. Patient not taking: Reported on 08/14/2019 01/24/19   Augusto Gamble B, NP  rosuvastatin (CRESTOR) 10 MG tablet Take 1 tablet (10 mg total) by mouth daily. 05/09/18 01/24/19  Antony Blackbird, MD   Allergies    Patient has no known allergies.  Review of Systems   Review of Systems  Constitutional: Negative for chills and fever.  HENT: Negative for congestion and rhinorrhea.   Respiratory: Positive for chest tightness and shortness of breath. Negative for cough.   Cardiovascular: Positive for palpitations. Negative for chest pain and leg swelling.  Gastrointestinal: Negative for abdominal pain, diarrhea, nausea and vomiting.  Genitourinary: Positive for frequency (chronic and not acutely changed). Negative for difficulty urinating and dysuria.  Neurological: Negative for dizziness, syncope and numbness.  All other systems reviewed and are negative.  Physical Exam Updated Vital Signs BP 126/78   Pulse 92   Temp 98.8 F (37.1 C)   Resp (!) 29   Ht 4\' 11"  (1.499 m)   Wt 92 kg   LMP 11/12/2015 (LMP Unknown) Comment: spotting  SpO2 98%   BMI 40.97 kg/m   Physical Exam Vitals and nursing note reviewed.  Constitutional:      General: She is not in acute distress.    Appearance: Normal appearance. She is obese. She is not toxic-appearing or diaphoretic.  HENT:     Head: Normocephalic and atraumatic.     Right Ear: External ear normal.     Left Ear: External ear normal.     Nose: Nose normal. No congestion.     Mouth/Throat:     Mouth: Mucous membranes are moist.     Pharynx: Oropharynx is clear. No oropharyngeal exudate or posterior oropharyngeal erythema.  Eyes:     General: No scleral icterus.       Right eye: No  discharge.        Left eye: No discharge.     Extraocular Movements: Extraocular movements intact.     Conjunctiva/sclera: Conjunctivae normal.     Pupils: Pupils are equal, round, and reactive to light.  Cardiovascular:     Rate and Rhythm: Normal rate and regular rhythm.     Pulses: Normal pulses.     Heart sounds: Normal heart sounds. No murmur. No friction rub. No gallop.   Pulmonary:     Effort: Pulmonary effort is normal. No respiratory distress.     Breath sounds: Normal breath sounds. No stridor. No wheezing, rhonchi or rales.  Abdominal:     General: Abdomen is flat.     Palpations: Abdomen is soft.     Tenderness: There is no abdominal tenderness.  Musculoskeletal:        General: Normal range of motion.     Cervical back: Normal range of motion and neck supple. No tenderness.  Skin:    General: Skin is warm and dry.     Capillary Refill: Capillary refill takes less than 2 seconds.  Neurological:     General: No focal deficit present.     Mental Status: She is alert and oriented to person, place, and time.  Psychiatric:        Mood and Affect: Mood normal.        Behavior: Behavior normal.    ED Results / Procedures / Treatments   Labs (all labs ordered are listed, but only abnormal results are displayed) Labs Reviewed  BASIC METABOLIC PANEL - Abnormal; Notable for the following components:      Result Value   Glucose, Bld 117 (*)    All other components within normal limits  CBC - Abnormal; Notable for the following components:   WBC 11.7 (*)    All other components within normal limits  I-STAT BETA HCG BLOOD, ED ([facility], [facility], [facility] ONLY)  TROPONIN I (HIGH SENSITIVITY)  TROPONIN I (HIGH SENSITIVITY)   EKG EKG Interpretation  Date/Time:  Saturday September 12 2019 22:39:10 EDT Ventricular Rate:  99 PR Interval:  142 QRS Duration: 84 QT Interval:  346 QTC Calculation: 444 R Axis:   -3 Text Interpretation: Normal sinus rhythm Normal ECG No significant change since  last tracing Confirmed by Pryor Curia [phone]) on 09/13/2019 3:05:45 AM   Radiology DG Chest 2 View  Result Date: 09/12/2019 CLINICAL DATA:  Short of breath, left-sided chest pain for 2 days EXAM: CHEST - 2 VIEW COMPARISON:  10/12/2017 FINDINGS: The heart size and mediastinal contours are within normal limits. Both lungs are clear. The visualized skeletal structures are unremarkable. IMPRESSION: No active cardiopulmonary disease. Electronically Signed   By: Randa Ngo M.D.   On: 09/12/2019 23:47   Procedures Procedures   Medications Ordered in ED Medications  sodium chloride flush (NS) 0.9 % injection 3 mL (has no administration in time range)    ED Course  I have reviewed the triage vital signs and the nursing notes.  Pertinent labs & imaging results that were available during my care of the patient were reviewed by me and considered in my medical decision making (see chart for details).    MDM Rules/Calculators/A&P                      1:48 AM patient is a 49 year old female presents with an episode of chest tightness that occurred 4 hours ago.  Patient states she was recently seen by her primary care provider earlier this week and was placed on lisinopril for high blood pressure as well as an unknown statin medication.  Initial labs are reassuring.  ECG shows normal sinus rhythm.  Chest x-ray is negative.  Negative troponin.  While talking with the patient her blood pressure decreased to the [id number] systolic and heart rate lowered from 105 to 85.  Will wait for second troponin.  If negative, will discharge with PCP follow-up.  3:19 AM second troponin is negative.  Vital signs are stable while I am in the room.  She is not tachycardic.  Her oxygen saturations are at 99%.  Discussed  her results via [place] interpreter.  She had no further questions at this time.  She knows to follow-up with her primary care provider to discuss his visit.  She has a history of GERD and requesting an  additional medication for this.  I am prescribing her Pepcid.  I gave her strict return precautions and she understands to return to the emergency department with any new or worsening symptoms.  She was amicable at the time of discharge.  Note: Portions of this report may have been transcribed using voice recognition software. Every effort was made to ensure accuracy; however, inadvertent computerized transcription errors may be present.    Final Clinical Impression(s) / ED Diagnoses Final diagnoses:  Feeling of chest tightness    Rx / DC Orders ED Discharge Orders         Ordered    famotidine (PEPCID) 40 MG tablet  Daily     09/13/19 0319           Rayna Sexton, PA-C 09/13/19 0325    Ward, Delice Bison, DO 09/13/19 [phone]

## 2019-09-13 NOTE — Discharge Instructions (Signed)
I have prescribed you a medication called Pepcid which you can take for heartburn.  Please take this medication once a day in the morning before you eat.  Please do not hesitate to return to the emergency department with any new or worsening symptoms.  Please follow-up with your primary care provider to discuss this visit.

## 2019-09-13 NOTE — ED Notes (Signed)
Patient verbalizes understanding of discharge instructions. Opportunity for questioning and answers were provided. Armband removed by staff, pt discharged from ED. Pt. ambulatory and discharged home.  

## 2019-09-16 ENCOUNTER — Ambulatory Visit (HOSPITAL_COMMUNITY): Payer: Self-pay

## 2019-09-21 ENCOUNTER — Other Ambulatory Visit: Payer: Self-pay | Admitting: Family Medicine

## 2019-10-26 ENCOUNTER — Other Ambulatory Visit: Payer: Self-pay

## 2019-10-26 ENCOUNTER — Ambulatory Visit: Payer: Self-pay | Attending: Family Medicine

## 2019-11-05 ENCOUNTER — Other Ambulatory Visit: Payer: Self-pay

## 2019-11-05 ENCOUNTER — Ambulatory Visit: Payer: Self-pay | Attending: Family Medicine

## 2020-07-02 ENCOUNTER — Encounter (HOSPITAL_COMMUNITY): Payer: Self-pay

## 2020-07-02 ENCOUNTER — Other Ambulatory Visit: Payer: Self-pay

## 2020-07-02 ENCOUNTER — Ambulatory Visit (HOSPITAL_COMMUNITY)
Admission: EM | Admit: 2020-07-02 | Discharge: 2020-07-02 | Disposition: A | Discharge status: Home / Self Care | Payer: Self-pay | Attending: Internal Medicine | Admitting: Internal Medicine

## 2020-07-02 DIAGNOSIS — R109 Unspecified abdominal pain: Secondary | ICD-10-CM

## 2020-07-02 DIAGNOSIS — M545 Low back pain, unspecified: Secondary | ICD-10-CM

## 2020-07-02 LAB — POCT URINALYSIS DIPSTICK, ED / UC
Bilirubin Urine: NEGATIVE
Glucose, UA: NEGATIVE mg/dL
Hgb urine dipstick: NEGATIVE
Ketones, ur: NEGATIVE mg/dL
Leukocytes,Ua: NEGATIVE
Nitrite: NEGATIVE
Protein, ur: NEGATIVE mg/dL
Specific Gravity, Urine: 1.02 (ref 1.005–1.030)
Urobilinogen, UA: 0.2 mg/dL (ref 0.0–1.0)
pH: 6 (ref 5.0–8.0)

## 2020-07-02 LAB — CBG MONITORING, ED: Glucose-Capillary: 92 mg/dL (ref 70–99)

## 2020-07-02 MED ORDER — IBUPROFEN 600 MG PO TABS: 600.0000 mg | ORAL_TABLET | Freq: Four times a day (QID) | ORAL | 0 refills | Status: DC | PRN

## 2020-07-02 MED ORDER — METHOCARBAMOL 500 MG PO TABS: 500.0000 mg | ORAL_TABLET | Freq: Two times a day (BID) | ORAL | 0 refills | Status: DC

## 2020-07-02 NOTE — ED Triage Notes (Signed)
Patient complains of right flank pain as well as frequent urination x 1 week. Pt is aox4 and ambulatory.

## 2020-07-02 NOTE — Discharge Instructions (Signed)
Your urine is normal.    Take the prescribed ibuprofen as needed for your pain.  Take the muscle relaxer as needed for muscle spasm; Do not drive, operate machinery, or drink alcohol with this medication as it may make you drowsy.    Follow up with your primary care provider if your symptoms are not improving.

## 2020-07-02 NOTE — ED Provider Notes (Signed)
[facility]    CSN: [id number] Arrival date & time: 07/02/20  1013      History   Chief Complaint Chief Complaint  Patient presents with  . Flank Pain    Right side x 1 week    HPI Darlene Salazar is a 50 y.o. female.   Accompanied by her daughter, patient presents with right flank pain and lower back pain x1 week.  No falls or injury.  She denies numbness, weakness, fever, chills, chest pain, shortness of breath, abdominal pain, dysuria, hematuria, pelvic pain, or other symptoms.  Treatment attempted at home with ibuprofen.  Her medical history includes hypertension, morbid obesity, prediabetes, anxiety, depression.  The history is provided by the patient and a relative. A language interpreter was used.    Past Medical History:  Diagnosis Date  . Anemia   . Depression   . High cholesterol   . Hypertension    no med, patient denies  . Menorrhagia   . Shortness of breath dyspnea    with exertion    Patient Active Problem List   Diagnosis Date Noted  . Prediabetes 10/29/2016  . Gastroesophageal reflux disease without esophagitis 10/29/2016  . Palpitations 02/14/2016  . Atypical chest pain 02/14/2016  . Post-operative state 11/29/2015  . Menorrhagia with regular cycle 07/28/2015  . 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 Anxiety 05/21/2014  . Morbid obesity ([place]) 05/21/2014  . Dysmenorrhea 05/21/2014    Past Surgical History:  Procedure Laterality Date  . ABDOMINAL HYSTERECTOMY    . BLADDER SURGERY    . CHOLECYSTECTOMY    . LAPAROSCOPIC VAGINAL HYSTERECTOMY WITH SALPINGECTOMY Bilateral 11/29/2015   Procedure: LAPAROSCOPIC ASSISTED VAGINAL HYSTERECTOMY WITH SALPINGECTOMY;  Surgeon: Emily Filbert, MD;  Location: [facility];  Service: Gynecology;  Laterality: Bilateral;  . WISDOM TOOTH EXTRACTION      OB History    Gravida  5   Para  3   Term  3   Preterm  0   AB  2   Living  3     SAB  2   IAB  0   Ectopic  0   Multiple  0    Live Births               Home Medications    Prior to Admission medications   Medication Sig Start Date End Date Taking? Authorizing Provider  ibuprofen (ADVIL) 600 MG tablet Take 1 tablet (600 mg total) by mouth every 6 (six) hours as needed. 07/02/20  Yes Sharion Balloon, NP  methocarbamol (ROBAXIN) 500 MG tablet Take 1 tablet (500 mg total) by mouth 2 (two) times daily. 07/02/20  Yes Sharion Balloon, NP  famotidine (PEPCID) 40 MG tablet Take 1 tablet (40 mg total) by mouth daily. 09/13/19   Rayna Sexton, PA-C  metFORMIN (GLUCOPHAGE) 500 MG tablet Take one pill once per day with a meal Patient not taking: No sig reported 03/31/18   Fulp, Cammie, MD  omeprazole (PRILOSEC) 20 MG capsule Take 1 capsule (20 mg total) by mouth daily. Patient not taking: No sig reported 01/24/19   Augusto Gamble B, NP  rosuvastatin (CRESTOR) 10 MG tablet Take 1 tablet (10 mg total) by mouth daily. 05/09/18 01/24/19  Antony Blackbird, MD    Family History Family History  Problem Relation Age of Onset  . Diabetes Father   . Hyperlipidemia Father   . Hypertension Father   . Cancer Mother 52       uterine  .  Diabetes Brother     Social History Social History   Tobacco Use  . Smoking status: Never Smoker  . Smokeless tobacco: Never Used  Vaping Use  . Vaping Use: Never used  Substance Use Topics  . Alcohol use: No  . Drug use: No     Allergies   Patient has no known allergies.   Review of Systems Review of Systems  Constitutional: Negative for chills and fever.  HENT: Negative for ear pain and sore throat.   Eyes: Negative for pain and visual disturbance.  Respiratory: Negative for cough and shortness of breath.   Cardiovascular: Negative for chest pain and palpitations.  Gastrointestinal: Negative for abdominal pain and vomiting.  Genitourinary: Positive for flank pain. Negative for dysuria, hematuria, pelvic pain and vaginal discharge.  Musculoskeletal: Positive for back pain. Negative for  arthralgias.  Skin: Negative for color change and rash.  Neurological: Negative for syncope, weakness and numbness.  All other systems reviewed and are negative.    Physical Exam Triage Vital Signs ED Triage Vitals  Enc Vitals Group     BP      Pulse      Resp      Temp      Temp src      SpO2      Weight      Height      Head Circumference      Peak Flow      Pain Score      Pain Loc      Pain Edu?      Excl. in [place]?    No data found.  Updated Vital Signs Pulse 71   Temp 98.1 F (36.7 C) (Oral)   Resp 18   LMP 11/12/2015 (LMP Unknown) Comment: spotting  SpO2 100%   Visual Acuity Right Eye Distance:   Left Eye Distance:   Bilateral Distance:    Right Eye Near:   Left Eye Near:    Bilateral Near:     Physical Exam Vitals and nursing note reviewed.  Constitutional:      General: She is not in acute distress.    Appearance: She is well-developed and well-nourished. She is obese. She is not ill-appearing.  HENT:     Head: Normocephalic and atraumatic.     Mouth/Throat:     Mouth: Mucous membranes are moist.  Eyes:     Conjunctiva/sclera: Conjunctivae normal.  Cardiovascular:     Rate and Rhythm: Normal rate and regular rhythm.     Heart sounds: Normal heart sounds.  Pulmonary:     Effort: Pulmonary effort is normal. No respiratory distress.     Breath sounds: Normal breath sounds.  Abdominal:     General: Bowel sounds are normal.     Palpations: Abdomen is soft.     Tenderness: There is no abdominal tenderness. There is no right CVA tenderness, left CVA tenderness, guarding or rebound.  Musculoskeletal:        General: No swelling, tenderness, deformity or edema. Normal range of motion.     Cervical back: Neck supple.  Skin:    General: Skin is warm and dry.     Capillary Refill: Capillary refill takes less than 2 seconds.     Findings: No bruising, erythema, lesion or rash.  Neurological:     General: No focal deficit present.     Mental Status:  She is alert and oriented to person, place, and time.     Sensory: No  sensory deficit.     Motor: No weakness.     Gait: Gait normal.     Comments: Negative straight leg raise.  Psychiatric:        Mood and Affect: Mood and affect and mood normal.        Behavior: Behavior normal.      UC Treatments / Results  Labs (all labs ordered are listed, but only abnormal results are displayed) Labs Reviewed  POCT URINALYSIS DIPSTICK, ED / UC  CBG MONITORING, ED    EKG   Radiology No results found.  Procedures Procedures (including critical care time)  Medications Ordered in UC Medications - No data to display  Initial Impression / Assessment and Plan / UC Course  I have reviewed the triage vital signs and the nursing notes.  Pertinent labs & imaging results that were available during my care of the patient were reviewed by me and considered in my medical decision making (see chart for details).   Right flank pain, right lower back pain.  Urine normal.  Patient is well-appearing and her exam is reassuring.  Treating with ibuprofen and methocarbamol.  Precautions for drowsiness with muscle relaxer discussed.  Instructed patient to follow-up with her PCP if her symptoms are not improving.  She agrees to plan of care.   Final Clinical Impressions(s) / UC Diagnoses   Final diagnoses:  Right flank pain  Acute right-sided low back pain without sciatica     Discharge Instructions     Your urine is normal.    Take the prescribed ibuprofen as needed for your pain.  Take the muscle relaxer as needed for muscle spasm; Do not drive, operate machinery, or drink alcohol with this medication as it may make you drowsy.    Follow up with your primary care provider if your symptoms are not improving.           ED Prescriptions    Medication Sig Dispense Auth. Provider   ibuprofen (ADVIL) 600 MG tablet Take 1 tablet (600 mg total) by mouth every 6 (six) hours as needed. 30 tablet  Sharion Balloon, NP   methocarbamol (ROBAXIN) 500 MG tablet Take 1 tablet (500 mg total) by mouth 2 (two) times daily. 20 tablet Sharion Balloon, NP     PDMP not reviewed this encounter.   Sharion Balloon, NP 07/02/20 1146

## 2020-07-21 ENCOUNTER — Emergency Department (HOSPITAL_COMMUNITY): Payer: Self-pay

## 2020-07-21 ENCOUNTER — Encounter (HOSPITAL_COMMUNITY): Payer: Self-pay | Admitting: Emergency Medicine

## 2020-07-21 ENCOUNTER — Emergency Department (HOSPITAL_COMMUNITY)
Admission: EM | Admit: 2020-07-21 | Discharge: 2020-07-21 | Disposition: A | Discharge status: Home / Self Care | Payer: Self-pay | Attending: Emergency Medicine | Admitting: Emergency Medicine

## 2020-07-21 DIAGNOSIS — I10 Essential (primary) hypertension: Secondary | ICD-10-CM | POA: Insufficient documentation

## 2020-07-21 DIAGNOSIS — R109 Unspecified abdominal pain: Secondary | ICD-10-CM | POA: Insufficient documentation

## 2020-07-21 DIAGNOSIS — M545 Low back pain, unspecified: Secondary | ICD-10-CM | POA: Insufficient documentation

## 2020-07-21 DIAGNOSIS — Z79899 Other long term (current) drug therapy: Secondary | ICD-10-CM | POA: Insufficient documentation

## 2020-07-21 LAB — URINALYSIS, ROUTINE W REFLEX MICROSCOPIC
Bilirubin Urine: NEGATIVE
Glucose, UA: NEGATIVE mg/dL
Ketones, ur: NEGATIVE mg/dL
Leukocytes,Ua: NEGATIVE
Nitrite: NEGATIVE
Protein, ur: NEGATIVE mg/dL
Specific Gravity, Urine: 1.01 (ref 1.005–1.030)
pH: 6 (ref 5.0–8.0)

## 2020-07-21 LAB — BASIC METABOLIC PANEL
Anion gap: 13 (ref 5–15)
BUN: 6 mg/dL (ref 6–20)
CO2: 25 mmol/L (ref 22–32)
Calcium: 9.2 mg/dL (ref 8.9–10.3)
Chloride: 100 mmol/L (ref 98–111)
Creatinine, Ser: 0.68 mg/dL (ref 0.44–1.00)
GFR, Estimated: >60 mL/min (ref >60)
Glucose, Bld: 127 mg/dL — ABNORMAL HIGH (ref 70–99)
Potassium: 3.6 mmol/L (ref 3.5–5.1)
Sodium: 138 mmol/L (ref 135–145)

## 2020-07-21 LAB — CBC
HCT: 39.7 % (ref 36.0–46.0)
Hemoglobin: 12.8 g/dL (ref 12.0–15.0)
MCH: 27.2 pg (ref 26.0–34.0)
MCHC: 32.2 g/dL (ref 30.0–36.0)
MCV: 84.5 fL (ref 80.0–100.0)
Platelets: 321 10*3/uL (ref 150–400)
RBC: 4.7 MIL/uL (ref 3.87–5.11)
RDW: 15 % (ref 11.5–15.5)
WBC: 7.9 10*3/uL (ref 4.0–10.5)
nRBC: 0 % (ref 0.0–0.2)

## 2020-07-21 LAB — URINALYSIS, MICROSCOPIC (REFLEX)

## 2020-07-21 LAB — I-STAT BETA HCG BLOOD, ED (MC, WL, AP ONLY): I-stat hCG, quantitative: <5 m[IU]/mL (ref <5)

## 2020-07-21 MED ORDER — CYCLOBENZAPRINE HCL 10 MG PO TABS: 10.0000 mg | ORAL_TABLET | Freq: Every day | ORAL | 0 refills | Status: AC

## 2020-07-21 MED ORDER — NAPROXEN 500 MG PO TABS: 500.0000 mg | ORAL_TABLET | Freq: Two times a day (BID) | ORAL | 0 refills | Status: DC

## 2020-07-21 NOTE — ED Notes (Signed)
Pt d/c home per MD order. No s/s of acute distress noted. Discharged home with family member.

## 2020-07-21 NOTE — ED Notes (Signed)
Provider at bedside

## 2020-07-21 NOTE — ED Triage Notes (Signed)
Pt arrives to ED with chief complaint of low back pain on both sides radiating into lower abd for 3 weeks. Denies any urinary symptoms.  Pt was seen on 1/15 for same and states it did resolve and came back.

## 2020-07-21 NOTE — ED Notes (Signed)
Patient transported to CT 

## 2020-07-21 NOTE — Discharge Instructions (Addendum)
Le estoy recetando un fuerte relajante muscular llamado flexeril. [employer] solo una vez por la noche con la cena. Este medicamento puede causarle bastante somnolencia. No lo mezcle con alcohol. No conduzca un vehculo despus de tomarlo.  Tambin te he recetado un antiinflamatorio llamado naproxeno. Por favor, tome esto segn lo prescrito. Puede tomar esto para Armed forces training and education officer. Trate de tomar esto con una pequea cantidad de comida ya que puede causar irritacin estomacal.  [employer] he dado informacin de seguimiento a continuacin con un ortopedista local. Por favor, llmelos si sus sntomas persisten. Si empeoran, siempre puede regresar al departamento de emergencias. Fue Engineer, technical sales.

## 2020-07-21 NOTE — ED Provider Notes (Signed)
[facility] EMERGENCY DEPARTMENT Provider Note   CSN: [id number] Arrival date & time: 07/21/20  0927     History Chief Complaint  Patient presents with  . Flank Pain    Darlene Salazar is a 50 y.o. female.  HPI Patient is a 50 year old female with a history of morbid obesity, prediabetes, high cholesterol, hypertension, who presents the emergency department due to back pain.  Patient states that about 3 to 4 weeks ago she was experiencing a cough which resulted in her experiencing some mild diffuse right-sided low back pain.  Her symptoms have progressed and she now notes diffuse lower back pain as well as her right lower back pain now radiating upwards towards her flank and across her right lateral abdomen.  No shortness of breath.  No current nausea or vomiting.  No urinary complaints.  Patient was initially evaluated in urgent care for similar complaints 2 weeks ago and was discharged on Robaxin as well as ibuprofen.  She reports mild relief initially but states that she is no longer getting relief with ibuprofen or Robaxin.    Past Medical History:  Diagnosis Date  . Anemia   . Depression   . High cholesterol   . Hypertension    no med, patient denies  . Menorrhagia   . Shortness of breath dyspnea    with exertion    Patient Active Problem List   Diagnosis Date Noted  . Prediabetes 10/29/2016  . Gastroesophageal reflux disease without esophagitis 10/29/2016  . Palpitations 02/14/2016  . Atypical chest pain 02/14/2016  . Post-operative state 11/29/2015  . Menorrhagia with regular cycle 07/28/2015  . 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 Anxiety 05/21/2014  . Morbid obesity ([place]) 05/21/2014  . Dysmenorrhea 05/21/2014    Past Surgical History:  Procedure Laterality Date  . ABDOMINAL HYSTERECTOMY    . BLADDER SURGERY    . CHOLECYSTECTOMY    . LAPAROSCOPIC VAGINAL HYSTERECTOMY WITH SALPINGECTOMY Bilateral 11/29/2015   Procedure:  LAPAROSCOPIC ASSISTED VAGINAL HYSTERECTOMY WITH SALPINGECTOMY;  Surgeon: Emily Filbert, MD;  Location: [facility];  Service: Gynecology;  Laterality: Bilateral;  . WISDOM TOOTH EXTRACTION       OB History    Gravida  5   Para  3   Term  3   Preterm  0   AB  2   Living  3     SAB  2   IAB  0   Ectopic  0   Multiple  0   Live Births              Family History  Problem Relation Age of Onset  . Diabetes Father   . Hyperlipidemia Father   . Hypertension Father   . Cancer Mother 33       uterine  . Diabetes Brother     Social History   Tobacco Use  . Smoking status: Never Smoker  . Smokeless tobacco: Never Used  Vaping Use  . Vaping Use: Never used  Substance Use Topics  . Alcohol use: No  . Drug use: No    Home Medications Prior to Admission medications   Medication Sig Start Date End Date Taking? Authorizing Provider  cyclobenzaprine (FLEXERIL) 10 MG tablet Take 1 tablet (10 mg total) by mouth at bedtime for 12 days. 07/21/20 08/02/20 Yes Rayna Sexton, PA-C  naproxen (NAPROSYN) 500 MG tablet Take 1 tablet (500 mg total) by mouth 2 (two) times daily. 07/21/20  Yes Rayna Sexton, PA-C  famotidine (PEPCID) 40 MG tablet Take 1 tablet (40 mg total) by mouth daily. 09/13/19   Rayna Sexton, PA-C  ibuprofen (ADVIL) 600 MG tablet Take 1 tablet (600 mg total) by mouth every 6 (six) hours as needed. 07/02/20   Sharion Balloon, NP  metFORMIN (GLUCOPHAGE) 500 MG tablet Take one pill once per day with a meal Patient not taking: No sig reported 03/31/18   Fulp, Cammie, MD  methocarbamol (ROBAXIN) 500 MG tablet Take 1 tablet (500 mg total) by mouth 2 (two) times daily. 07/02/20   Sharion Balloon, NP  omeprazole (PRILOSEC) 20 MG capsule Take 1 capsule (20 mg total) by mouth daily. Patient not taking: No sig reported 01/24/19   Augusto Gamble B, NP  rosuvastatin (CRESTOR) 10 MG tablet Take 1 tablet (10 mg total) by mouth daily. 05/09/18 01/24/19  Antony Blackbird, MD    Allergies     Patient has no known allergies.  Review of Systems   Review of Systems  All other systems reviewed and are negative. Ten systems reviewed and are negative for acute change, except as noted in the HPI.    Physical Exam Updated Vital Signs BP (!) 141/84 (BP Location: Right Arm)   Pulse 94   Temp 98.1 F (36.7 C)   Resp 16   LMP 11/12/2015 (LMP Unknown) Comment: spotting  SpO2 97%   Physical Exam Vitals and nursing note reviewed.  Constitutional:      General: She is not in acute distress.    Appearance: Normal appearance. She is not ill-appearing, toxic-appearing or diaphoretic.  HENT:     Head: Normocephalic and atraumatic.     Right Ear: External ear normal.     Left Ear: External ear normal.     Nose: Nose normal.     Mouth/Throat:     Mouth: Mucous membranes are moist.     Pharynx: Oropharynx is clear. No oropharyngeal exudate or posterior oropharyngeal erythema.  Eyes:     Extraocular Movements: Extraocular movements intact.  Cardiovascular:     Rate and Rhythm: Normal rate and regular rhythm.     Pulses: Normal pulses.     Heart sounds: Normal heart sounds. No murmur heard. No friction rub. No gallop.   Pulmonary:     Effort: Pulmonary effort is normal. No respiratory distress.     Breath sounds: Normal breath sounds. No stridor. No wheezing, rhonchi or rales.  Abdominal:     Palpations: Abdomen is soft.     Tenderness: There is abdominal tenderness. There is right CVA tenderness. There is no left CVA tenderness.     Comments: Protuberant abdomen.  Abdomen is soft.  Mild tenderness noted diffusely along the right lateral abdominal wall.  Mild right CVA tenderness.  No left CVA tenderness.  Musculoskeletal:        General: Tenderness present. Normal range of motion.     Cervical back: Normal range of motion and neck supple. No tenderness.     Comments: Moderate TTP noted diffusely along the lumbar region, right greater than left.  There is mild diffuse midline  lumbar spine pain.  No midline cervical or thoracic spine pain.  Skin:    General: Skin is warm and dry.  Neurological:     General: No focal deficit present.     Mental Status: She is alert and oriented to person, place, and time.  Psychiatric:        Mood and Affect: Mood normal.  Behavior: Behavior normal.    ED Results / Procedures / Treatments   Labs (all labs ordered are listed, but only abnormal results are displayed) Labs Reviewed  URINALYSIS, ROUTINE W REFLEX MICROSCOPIC - Abnormal; Notable for the following components:      Result Value   APPearance HAZY (*)    Hgb urine dipstick TRACE (*)    All other components within normal limits  BASIC METABOLIC PANEL - Abnormal; Notable for the following components:   Glucose, Bld 127 (*)    All other components within normal limits  URINALYSIS, MICROSCOPIC (REFLEX) - Abnormal; Notable for the following components:   Bacteria, UA MANY (*)    All other components within normal limits  CBC  I-STAT BETA HCG BLOOD, ED ([facility], [facility], [facility] ONLY)   EKG None  Radiology CT Renal Stone Study  Result Date: 07/21/2020 CLINICAL DATA:  RIGHT flank pain for 2 days EXAM: CT ABDOMEN AND PELVIS WITHOUT CONTRAST TECHNIQUE: Multidetector CT imaging of the abdomen and pelvis was performed following the standard protocol without IV contrast. Sagittal and coronal MPR images reconstructed from axial data set. No oral contrast administered. COMPARISON:  08/21/2015 FINDINGS: Lower chest: Lung bases clear Hepatobiliary: Diffuse fatty infiltration of liver. Gallbladder surgically absent. No biliary dilatation. Pancreas: Normal appearance Spleen: Normal appearance Adrenals/Urinary Tract: Adrenal glands normal appearance. Kidneys, ureters, and bladder normal appearance. No urinary tract calcification or dilatation. Stomach/Bowel: Normal appendix. Stomach and bowel loops normal appearance Vascular/Lymphatic: Minimal atherosclerotic calcification aorta. Aorta normal  caliber. No adenopathy. Reproductive: Uterus surgically absent.  Normal sized ovaries. Other: No free air or free fluid. Tiny umbilical hernia containing fat. No inflammatory process. Musculoskeletal: Segmentation anomalies with fusion of T12-L1, L2-L3, and questionably remaining lumbar spine. Grade 1 anterolisthesis L5-S1 suspect due to a significant facet degenerative changes. IMPRESSION: Fatty infiltration of liver. Tiny umbilical hernia containing fat. No acute intra-abdominal or intrapelvic abnormalities. Specifically, no urinary tract calcification or dilatation. Aortic Atherosclerosis (ICD10-I70.0). Electronically Signed   By: Lavonia Dana M.D.   On: 07/21/2020 12:40    Procedures Procedures   Medications Ordered in ED Medications - No data to display  ED Course  I have reviewed the triage vital signs and the nursing notes.  Pertinent labs & imaging results that were available during my care of the patient were reviewed by me and considered in my medical decision making (see chart for details).    MDM Rules/Calculators/A&P                          Pt is a 50 y.o. female who presents the emergency department due to low back pain, flank pain, as well as right-sided abdominal pain.  Labs: CBC negative. BMP with a glucose of 127.  Otherwise negative.  Normal kidney function. UA shows trace hemoglobin and many bacteria.  Otherwise negative.  Imaging: CT of the abdomen and pelvis without contrast shows fatty infiltration of the liver.  Tiny umbilical hernia containing fat.  No acute intra-abdominal or intrapelvic abnormalities.  I, Rayna Sexton, PA-C, personally reviewed and evaluated these images and lab results as part of my medical decision-making.  Patient with palpable low back, right flank, as well as right-sided abdominal pain.  Reassuring CT scan as well as lab work today.  Feel that her symptoms are likely musculoskeletal.  She does note that they initially started after a  coughing bout a few weeks prior.  Patient initially took ibuprofen as well as Robaxin with  mild short-term relief.  We will start patient on naproxen as well as Flexeril to see if this improves her symptoms.  Recommended PCP follow-up.  Patient given orthopedic referral as well if her symptoms persist.  Feel that she is stable for discharge.  Her questions were answered and she was amicable at the time of discharge.  Note: Portions of this report may have been transcribed using voice recognition software. Every effort was made to ensure accuracy; however, inadvertent computerized transcription errors may be present.   Final Clinical Impression(s) / ED Diagnoses Final diagnoses:  Lumbar pain  Right flank pain    Rx / DC Orders ED Discharge Orders         Ordered    naproxen (NAPROSYN) 500 MG tablet  2 times daily        07/21/20 1302    cyclobenzaprine (FLEXERIL) 10 MG tablet  Daily at bedtime        07/21/20 1302           Rayna Sexton, PA-C 07/21/20 1303    Charlesetta Shanks, MD 07/26/20 [phone]

## 2021-09-17 ENCOUNTER — Other Ambulatory Visit: Payer: Self-pay

## 2021-09-17 ENCOUNTER — Ambulatory Visit (HOSPITAL_COMMUNITY)
Admission: EM | Admit: 2021-09-17 | Discharge: 2021-09-17 | Disposition: A | Discharge status: Home / Self Care | Payer: Self-pay | Attending: Family Medicine | Admitting: Family Medicine

## 2021-09-17 ENCOUNTER — Encounter (HOSPITAL_COMMUNITY): Payer: Self-pay | Admitting: *Deleted

## 2021-09-17 DIAGNOSIS — R112 Nausea with vomiting, unspecified: Secondary | ICD-10-CM

## 2021-09-17 MED ORDER — ONDANSETRON 4 MG PO TBDP
ORAL_TABLET | ORAL | Status: AC
  Filled 2021-09-17: qty 1

## 2021-09-17 MED ORDER — ONDANSETRON HCL 4 MG PO TABS
4.0000 mg | ORAL_TABLET | Freq: Four times a day (QID) | ORAL | 0 refills | Status: DC
  Filled 2021-09-17: qty 12, 3d supply, fill #0

## 2021-09-17 MED ORDER — ONDANSETRON 4 MG PO TBDP
4.0000 mg | ORAL_TABLET | Freq: Once | ORAL | Status: AC
  Administered 2021-09-17: 4 mg via ORAL

## 2021-09-17 NOTE — ED Provider Notes (Signed)
?[facility] ? ? ? ?CSN: [id number] ?Arrival date & time: 09/17/21  1646 ? ? ?  ? ?History   ?Chief Complaint ?Chief Complaint  ?Patient presents with  ? Emesis  ? ? ?HPI ?Darlene Salazar is a 51 y.o. female.  ? ?Patient is here for vomiting since last night.  When she vomits she has pain.  Vomited 10 or more times.   ?She has not been able to keep anything down.  ?No diarrhea or constipation.  ?No fevers.  ?Her husband was sick with acid reflux, no vomiting last week.  ? ?Past Medical History:  ?Diagnosis Date  ? Anemia   ? Depression   ? High cholesterol   ? Hypertension   ? no med, patient denies  ? Menorrhagia   ? Shortness of breath dyspnea   ? with exertion  ? ? ?Patient Active Problem List  ? Diagnosis Date Noted  ? Prediabetes 10/29/2016  ? Gastroesophageal reflux disease without esophagitis 10/29/2016  ? Palpitations 02/14/2016  ? Atypical chest pain 02/14/2016  ? Post-operative state 11/29/2015  ? Menorrhagia with regular cycle 07/28/2015  ? Submucous leiomyoma of uterus 07/28/2015  ? Anxiety 05/21/2014  ? Morbid obesity ([place]) 05/21/2014  ? Dysmenorrhea 05/21/2014  ? ? ?Past Surgical History:  ?Procedure Laterality Date  ? ABDOMINAL HYSTERECTOMY    ? BLADDER SURGERY    ? CHOLECYSTECTOMY    ? LAPAROSCOPIC VAGINAL HYSTERECTOMY WITH SALPINGECTOMY Bilateral 11/29/2015  ? Procedure: LAPAROSCOPIC ASSISTED VAGINAL HYSTERECTOMY WITH SALPINGECTOMY;  Surgeon: Emily Filbert, MD;  Location: [facility];  Service: Gynecology;  Laterality: Bilateral;  ? WISDOM TOOTH EXTRACTION    ? ? ?OB History   ? ? Gravida  ?5  ? Para  ?3  ? Term  ?3  ? Preterm  ?0  ? AB  ?2  ? Living  ?3  ?  ? ? SAB  ?2  ? IAB  ?0  ? Ectopic  ?0  ? Multiple  ?0  ? Live Births  ?   ?   ?  ?  ? ? ? ?Home Medications   ? ?Prior to Admission medications   ?Medication Sig Start Date End Date Taking? Authorizing Provider  ?famotidine (PEPCID) 40 MG tablet Take 1 tablet (40 mg total) by mouth daily. 09/13/19   Rayna Sexton, PA-C  ?ibuprofen  (ADVIL) 600 MG tablet Take 1 tablet (600 mg total) by mouth every 6 (six) hours as needed. 07/02/20   Sharion Balloon, NP  ?metFORMIN (GLUCOPHAGE) 500 MG tablet Take one pill once per day with a meal ?Patient not taking: No sig reported 03/31/18   Fulp, Cammie, MD  ?methocarbamol (ROBAXIN) 500 MG tablet Take 1 tablet (500 mg total) by mouth 2 (two) times daily. 07/02/20   Sharion Balloon, NP  ?naproxen (NAPROSYN) 500 MG tablet Take 1 tablet (500 mg total) by mouth 2 (two) times daily. 07/21/20   Rayna Sexton, PA-C  ?omeprazole (PRILOSEC) 20 MG capsule Take 1 capsule (20 mg total) by mouth daily. ?Patient not taking: No sig reported 01/24/19   Zigmund Gottron, NP  ?rosuvastatin (CRESTOR) 10 MG tablet Take 1 tablet (10 mg total) by mouth daily. 05/09/18 01/24/19  Antony Blackbird, MD  ? ? ?Family History ?Family History  ?Problem Relation Age of Onset  ? Diabetes Father   ? Hyperlipidemia Father   ? Hypertension Father   ? Cancer Mother 62  ?     uterine  ? Diabetes Brother   ? ? ?  Social History ?Social History  ? ?Tobacco Use  ? Smoking status: Never  ? Smokeless tobacco: Never  ?Vaping Use  ? Vaping Use: Never used  ?Substance Use Topics  ? Alcohol use: No  ? Drug use: No  ? ? ? ?Allergies   ?Patient has no known allergies. ? ? ?Review of Systems ?Review of Systems  ?Constitutional:  Negative for chills and fever.  ?HENT: Negative.    ?Respiratory: Negative.    ?Cardiovascular: Negative.   ?Gastrointestinal:  Positive for vomiting. Negative for abdominal pain.  ? ? ?Physical Exam ?Triage Vital Signs ?ED Triage Vitals  ?Enc Vitals Group  ?   BP 09/17/21 1738 119/83  ?   Pulse Rate 09/17/21 1738 (!) 102  ?   Resp 09/17/21 1738 18  ?   Temp 09/17/21 1738 98.9 ?F (37.2 ?C)  ?   Temp src --   ?   SpO2 09/17/21 1738 97 %  ?   Weight --   ?   Height --   ?   Head Circumference --   ?   Peak Flow --   ?   Pain Score 09/17/21 1736 0  ?   Pain Loc --   ?   Pain Edu? --   ?   Excl. in [place]? --   ? ?No data found. ? ?Updated Vital Signs ?BP  119/83   Pulse (!) 102   Temp 98.9 ?F (37.2 ?C)   Resp 18   LMP 11/12/2015 (LMP Unknown) Comment: spotting  SpO2 97%  ? ?Visual Acuity ?Right Eye Distance:   ?Left Eye Distance:   ?Bilateral Distance:   ? ?Right Eye Near:   ?Left Eye Near:    ?Bilateral Near:    ? ?Physical Exam ?Constitutional:   ?   Appearance: Normal appearance.  ?HENT:  ?   Mouth/Throat:  ?   Mouth: Mucous membranes are moist.  ?Cardiovascular:  ?   Rate and Rhythm: Normal rate and regular rhythm.  ?Pulmonary:  ?   Effort: Pulmonary effort is normal.  ?   Breath sounds: Normal breath sounds.  ?Abdominal:  ?   General: Bowel sounds are normal.  ?   Palpations: Abdomen is soft.  ?   Tenderness: There is no abdominal tenderness. There is no guarding or rebound.  ?Musculoskeletal:  ?   Cervical back: Normal range of motion and neck supple.  ?Neurological:  ?   General: No focal deficit present.  ?   Mental Status: She is alert and oriented to person, place, and time.  ?Psychiatric:     ?   Mood and Affect: Mood normal.     ?   Behavior: Behavior normal.  ? ? ? ?UC Treatments / Results  ?Labs ?(all labs ordered are listed, but only abnormal results are displayed) ?Labs Reviewed - No data to display ? ?EKG ? ? ?Radiology ?No results found. ? ?Procedures ?Procedures (including critical care time) ? ?Medications Ordered in UC ?Medications - No data to display ? ?Initial Impression / Assessment and Plan / UC Course  ?I have reviewed the triage vital signs and the nursing notes. ? ?Pertinent labs & imaging results that were available during my care of the patient were reviewed by me and considered in my medical decision making (see chart for details). ? ?Patient was seen today for vomiting.  ?She looks good on exam, does not appear dehydrated.  No TTP on exam.  ?Will send home with zofran to help with  nausea.  ?If she has worsening symptoms, fever, chills then go to the ER for further evaluation and treatment.  ?  ?Final Clinical Impressions(s) / UC  Diagnoses  ? ?Final diagnoses:  ?Nausea and vomiting, unspecified vomiting type  ? ? ? ?Discharge Instructions   ? ?  ?You were seen today for vomiting.  ?I have sent out zofron '4mg'$  to take every 6 hrs/as needed for vomiting.  ?If you continue to vomit despite medication, and feel you are getting dehydrated, then please go to the ER for further evaluation and treatment.  ?Drink small amounts of clear liquids like ginger ale and gatorade.  ? ? ? ?ED Prescriptions   ? ? Medication Sig Dispense Auth. Provider  ? ondansetron (ZOFRAN) 4 MG tablet Take 1 tablet (4 mg total) by mouth every 6 (six) hours. 12 tablet Rondel Oh, MD  ? ?  ? ?PDMP not reviewed this encounter. ?  ?Rondel Oh, MD ?09/17/21 1756 ? ?

## 2021-09-17 NOTE — Discharge Instructions (Addendum)
You were seen today for vomiting.  ?I have sent out zofron '4mg'$  to take every 6 hrs/as needed for vomiting.  ?If you continue to vomit despite medication, and feel you are getting dehydrated, then please go to the ER for further evaluation and treatment.  ?Drink small amounts of clear liquids like ginger ale and gatorade.  ?

## 2021-09-17 NOTE — ED Triage Notes (Signed)
T reports she has been vomiting since last night. Pt has been unable to keep liquids or solids down.  ?

## 2021-09-18 ENCOUNTER — Other Ambulatory Visit: Payer: Self-pay

## 2021-09-25 ENCOUNTER — Other Ambulatory Visit: Payer: Self-pay

## 2022-05-23 ENCOUNTER — Ambulatory Visit (HOSPITAL_COMMUNITY)
Admission: EM | Admit: 2022-05-23 | Discharge: 2022-05-23 | Disposition: A | Discharge status: Home / Self Care | Payer: Self-pay | Attending: Physician Assistant | Admitting: Physician Assistant

## 2022-05-23 ENCOUNTER — Encounter (HOSPITAL_COMMUNITY): Payer: Self-pay | Admitting: Emergency Medicine

## 2022-05-23 DIAGNOSIS — G8929 Other chronic pain: Secondary | ICD-10-CM

## 2022-05-23 DIAGNOSIS — R079 Chest pain, unspecified: Secondary | ICD-10-CM

## 2022-05-23 DIAGNOSIS — K2100 Gastro-esophageal reflux disease with esophagitis, without bleeding: Secondary | ICD-10-CM

## 2022-05-23 DIAGNOSIS — M5441 Lumbago with sciatica, right side: Secondary | ICD-10-CM

## 2022-05-23 LAB — POCT URINALYSIS DIPSTICK, ED / UC
Bilirubin Urine: NEGATIVE
Glucose, UA: NEGATIVE mg/dL
Ketones, ur: NEGATIVE mg/dL
Leukocytes,Ua: NEGATIVE
Nitrite: NEGATIVE
Protein, ur: NEGATIVE mg/dL
Specific Gravity, Urine: 1.02 (ref 1.005–1.030)
Urobilinogen, UA: 0.2 mg/dL (ref 0.0–1.0)
pH: 6.5 (ref 5.0–8.0)

## 2022-05-23 MED ORDER — METHOCARBAMOL 500 MG PO TABS: 500.0000 mg | ORAL_TABLET | Freq: Three times a day (TID) | ORAL | 0 refills | Status: DC | PRN

## 2022-05-23 MED ORDER — OMEPRAZOLE 20 MG PO CPDR: 20.0000 mg | DELAYED_RELEASE_CAPSULE | Freq: Every day | ORAL | 0 refills | Status: DC

## 2022-05-23 MED ORDER — SUCRALFATE 1 G PO TABS: 1.0000 g | ORAL_TABLET | Freq: Three times a day (TID) | ORAL | 0 refills | Status: DC

## 2022-05-23 NOTE — ED Triage Notes (Signed)
Used interpretor: Pt back & hip pain and below that been ongoing for 3 months. Taking some pills before that helped and now not helping. Denies fall or injury. Pain rating: 9/10.  Pt also having posterior headaches for week that are intermittent. Took Excedrin for pains. Rate pain 8/10   Pt adds that also having Left side of chest cramping that been going on for 3 months but esp over the past couple weeks. "Pain is constant but then will go away". Took aspirin

## 2022-05-23 NOTE — ED Provider Notes (Signed)
[place]    CSN: [id number] Arrival date & time: 05/23/22  1132      History   Chief Complaint Chief Complaint  Patient presents with   Back Pain   Headache    HPI Siniya Lichty is a 51 y.o. female.   Patient presents today with several concerns.  She is Spanish-speaking and phone interpreter was utilized during visit.  Her primary concern today is 58-monthhistory of intermittent right lower back pain that radiates into her upper back/neck.  She denies any known injury or increase in activity prior to symptom onset.  She denies previous surgery involving her back.  She does report occasional numbness/paresthesias in her right leg.  She has tried over-the-counter medications with minimal improvement of symptoms.  She denies history of malignancy.  Denies any bowel/bladder incontinence, lower extremity weakness, saddle anesthesia.  She is able to ambulate without assistance.  She has not seen a specialist.  Reports that she is not currently followed by primary care as her primary care provider clinic closed and she has not been able to establish with anyone else.  In addition, she reports a 3 to 488-monthistory of intermittent left-sided chest discomfort.  She describes this as a cramping/electric shock with occasional burning.  It is exacerbated by eating.  She has tried aspirin without improvement of symptoms.  Denies personal or family history of cardiovascular disease.  She does have several risk factors including hyperlipidemia, prediabetes, hypertension.  She denies associated nausea, vomiting, diaphoresis, weakness.  She is currently asymptomatic but reports that symptoms occur daily on average.  She has not identified any additional triggers outside of eating.  She does have a history of GERD but is not currently taking any medication for this.    Past Medical History:  Diagnosis Date   Anemia    Depression    High cholesterol    Hypertension    no med,  patient denies   Menorrhagia    Shortness of breath dyspnea    with exertion    Patient Active Problem List   Diagnosis Date Noted   Prediabetes 10/29/2016   Gastroesophageal reflux disease without esophagitis 10/29/2016   Palpitations 02/14/2016   Atypical chest pain 02/14/2016   Post-operative state 11/29/2015   Menorrhagia with regular cycle 07/28/2015   Submucous leiomyoma of uterus 07/28/2015   Anxiety 05/21/2014   Morbid obesity ([place]/09/2013   Dysmenorrhea 05/21/2014    Past Surgical History:  Procedure Laterality Date   ABDOMINAL HYSTERECTOMY     BLADDER SURGERY     CHOLECYSTECTOMY     LAPAROSCOPIC VAGINAL HYSTERECTOMY WITH SALPINGECTOMY Bilateral 11/29/2015   Procedure: LAPAROSCOPIC ASSISTED VAGINAL HYSTERECTOMY WITH SALPINGECTOMY;  Surgeon: MyEmily FilbertMD;  [facility];  Service: Gynecology;  Laterality: Bilateral;   WISDOM TOOTH EXTRACTION      OB History     Gravida  5   Para  3   Term  3   Preterm  0   AB  2   Living  3      SAB  2   IAB  0   Ectopic  0   Multiple  0   Live Births               Home Medications    Prior to Admission medications   Medication Sig Start Date End Date Taking? Authorizing Provider  sucralfate (CARAFATE) 1 g tablet Take 1 tablet (1 g total) by mouth 4 (four) times daily -  with meals and at bedtime. 05/23/22  Yes Arnetia Bronk K, PA-C  ibuprofen (ADVIL) 600 MG tablet Take 1 tablet (600 mg total) by mouth every 6 (six) hours as needed. 07/02/20   Sharion Balloon, NP  methocarbamol (ROBAXIN) 500 MG tablet Take 1 tablet (500 mg total) by mouth every 8 (eight) hours as needed for muscle spasms. 05/23/22   Paizley Ramella, Derry Skill, PA-C  omeprazole (PRILOSEC) 20 MG capsule Take 1 capsule (20 mg total) by mouth daily. 05/23/22   Linnie Delgrande, Junie Panning K, PA-C  rosuvastatin (CRESTOR) 10 MG tablet Take 1 tablet (10 mg total) by mouth daily. 05/09/18 01/24/19  Antony Blackbird, MD    Family History Family History  Problem Relation Age  of Onset   Diabetes Father    Hyperlipidemia Father    Hypertension Father    Cancer Mother 37       uterine   Diabetes Brother     Social History Social History   Tobacco Use   Smoking status: Never   Smokeless tobacco: Never  Vaping Use   Vaping Use: Never used  Substance Use Topics   Alcohol use: No   Drug use: No     Allergies   Patient has no known allergies.   Review of Systems Review of Systems  Constitutional:  Positive for activity change. Negative for appetite change, fatigue and fever.  Respiratory:  Negative for cough and shortness of breath.   Cardiovascular:  Positive for chest pain.  Gastrointestinal:  Negative for abdominal pain, diarrhea, nausea and vomiting.  Genitourinary:  Negative for dysuria, frequency and urgency.  Musculoskeletal:  Positive for back pain. Negative for arthralgias and myalgias.  Neurological:  Positive for numbness. Negative for weakness.     Physical Exam Triage Vital Signs ED Triage Vitals  Enc Vitals Group     BP 05/23/22 1320 133/75     Pulse Rate 05/23/22 1320 72     Resp 05/23/22 1320 18     Temp 05/23/22 1320 98.2 F (36.8 C)     Temp Source 05/23/22 1320 Oral     SpO2 05/23/22 1320 99 %     Weight --      Height --      Head Circumference --      Peak Flow --      Pain Score 05/23/22 1316 9     Pain Loc --      Pain Edu? --      Excl. in [place]? --    No data found.  Updated Vital Signs BP 133/75 (BP Location: Left Arm)   Pulse 72   Temp 98.2 F (36.8 C) (Oral)   Resp 18   LMP 11/12/2015 (LMP Unknown) Comment: spotting  SpO2 99%   Visual Acuity Right Eye Distance:   Left Eye Distance:   Bilateral Distance:    Right Eye Near:   Left Eye Near:    Bilateral Near:     Physical Exam Vitals reviewed.  Constitutional:      General: She is awake. She is not in acute distress.    Appearance: Normal appearance. She is well-developed. She is not ill-appearing.     Comments: Very pleasant female  appears stated age no acute distress sitting comfortably in exam room  HENT:     Head: Normocephalic and atraumatic.  Cardiovascular:     Rate and Rhythm: Normal rate and regular rhythm.     Heart sounds: Normal heart sounds, S1 normal and S2 normal. No  murmur heard. Pulmonary:     Effort: Pulmonary effort is normal.     Breath sounds: Normal breath sounds. No wheezing, rhonchi or rales.     Comments: Clear to auscultation bilaterally Abdominal:     General: Bowel sounds are normal.     Palpations: Abdomen is soft.     Tenderness: There is abdominal tenderness in the suprapubic area. There is no right CVA tenderness, left CVA tenderness, guarding or rebound.  Musculoskeletal:     Cervical back: No tenderness or bony tenderness.     Thoracic back: Tenderness present. No bony tenderness.     Lumbar back: Tenderness present. No bony tenderness. Negative right straight leg raise test and negative left straight leg raise test.  Psychiatric:        Behavior: Behavior is cooperative.      UC Treatments / Results  Labs (all labs ordered are listed, but only abnormal results are displayed) Labs Reviewed  POCT URINALYSIS DIPSTICK, ED / UC - Abnormal; Notable for the following components:      Result Value   Hgb urine dipstick TRACE (*)    All other components within normal limits    EKG   Radiology No results found.  Procedures Procedures (including critical care time)  Medications Ordered in UC Medications - No data to display  Initial Impression / Assessment and Plan / UC Course  I have reviewed the triage vital signs and the nursing notes.  Pertinent labs & imaging results that were available during my care of the patient were reviewed by me and considered in my medical decision making (see chart for details).     Patient is well-appearing, afebrile, nontoxic, nontachycardic.  EKG was obtained that showed normal sinus rhythm with ventricular rate of 73 bpm without  ischemic changes; compared to 09/12/2019 tracing no significant change.  She is currently asymptomatic and denies any current chest pain.  Given intermittent nature with association with food suspect GERD as etiology of symptoms.  Patient is not currently taking any medication to manage this condition so was restarted on Prilosec as well as 1 week of Carafate.  Discussed dietary and lifestyle modification for additional symptom relief.  We did discuss that if she has any changing or worsening symptoms including shortness of breath, nausea/vomiting interfere with oral intake, weakness, recurrent chest pain she needs to go to the emergency room immediately.  We discussed that ultimately she may benefit from seeing either GI or cardiology depending on her response to medication and treatment plan.  She does not currently have a primary care so we will try to establish her with someone via PCP assistance.  Strict return precautions given to which she expressed understanding.  Suspect musculoskeletal etiology as etiology of symptoms.  Plan films were deferred as she has no recent trauma and denies any point bony tenderness she does not have any alarm symptoms based on evaluation today.  UA was obtained that showed trace blood that has been present for over a year but was otherwise normal.  Due to concern for GERD will avoid NSAIDs but she was started on Robaxin up to 3 times a day.  Discussed that this can be sedating and she is not to drive or drink alcohol with taking it.  Recommended conservative treatment measures including heat, rest, stretch.  She is to follow-up with orthopedics for further evaluation and management.  She was given contact information for local provider.  Discussed that if she has any worsening or changing  symptoms she needs to be reevaluated including bowel/bladder incontinence, lower extremity weakness, saddle anesthesia.  Strict return precautions given.  Final Clinical Impressions(s) / UC  Diagnoses   Final diagnoses:  Gastroesophageal reflux disease with esophagitis, unspecified whether hemorrhage  Intermittent chest pain  Chronic right-sided low back pain with right-sided sciatica     Discharge Instructions      I believe the chest pain is related to acid reflux.  Please start omeprazole daily.  Take this on empty stomach (20 minutes before a meal or 2 hours after).  Take Carafate before each meal and before bed.  Avoid NSAIDs including aspirin, ibuprofen/Advil, naproxen/Aleve.  Avoid alcohol.  If you have any worsening or changing symptoms you need to be seen again including chest pain, shortness of breath, nausea/vomiting.  Someone should reach out to you to schedule an appoint with a primary care doctor and you should see them as soon as possible.  I believe that your back pain is related to muscle injury.  Use Robaxin up to 3 times a day.  This will make you sleepy so do not drive or drink alcohol with taking it.  Follow-up with orthopedics.  Call them to schedule pain.  If anything changes or worsens please return for reevaluation.     ED Prescriptions     Medication Sig Dispense Auth. Provider   methocarbamol (ROBAXIN) 500 MG tablet Take 1 tablet (500 mg total) by mouth every 8 (eight) hours as needed for muscle spasms. 30 tablet Jayma Volpi K, PA-C   omeprazole (PRILOSEC) 20 MG capsule Take 1 capsule (20 mg total) by mouth daily. 30 capsule Yaxiel Minnie K, PA-C   sucralfate (CARAFATE) 1 g tablet Take 1 tablet (1 g total) by mouth 4 (four) times daily -  with meals and at bedtime. 28 tablet Eddie Payette, Derry Skill, PA-C      PDMP not reviewed this encounter.   Terrilee Croak, PA-C 05/23/22 1432

## 2022-05-23 NOTE — Discharge Instructions (Signed)
I believe the chest pain is related to acid reflux.  Please start omeprazole daily.  Take this on empty stomach (20 minutes before a meal or 2 hours after).  Take Carafate before each meal and before bed.  Avoid NSAIDs including aspirin, ibuprofen/Advil, naproxen/Aleve.  Avoid alcohol.  If you have any worsening or changing symptoms you need to be seen again including chest pain, shortness of breath, nausea/vomiting.  Someone should reach out to you to schedule an appoint with a primary care doctor and you should see them as soon as possible.  I believe that your back pain is related to muscle injury.  Use Robaxin up to 3 times a day.  This will make you sleepy so do not drive or drink alcohol with taking it.  Follow-up with orthopedics.  Call them to schedule pain.  If anything changes or worsens please return for reevaluation.

## 2022-10-22 ENCOUNTER — Other Ambulatory Visit: Payer: Self-pay

## 2022-10-22 ENCOUNTER — Encounter (HOSPITAL_COMMUNITY): Payer: Self-pay

## 2022-10-22 ENCOUNTER — Ambulatory Visit (HOSPITAL_COMMUNITY)
Admission: EM | Admit: 2022-10-22 | Discharge: 2022-10-22 | Disposition: A | Discharge status: Home / Self Care | Payer: Self-pay | Attending: Emergency Medicine | Admitting: Emergency Medicine

## 2022-10-22 DIAGNOSIS — N764 Abscess of vulva: Secondary | ICD-10-CM

## 2022-10-22 MED ORDER — DOXYCYCLINE HYCLATE 100 MG PO TABS
100.0000 mg | ORAL_TABLET | Freq: Two times a day (BID) | ORAL | 0 refills | Status: DC
  Filled 2022-10-22: qty 14, 7d supply, fill #0

## 2022-10-22 MED ORDER — LIDOCAINE HCL (PF) 1 % IJ SOLN
INTRAMUSCULAR | Status: AC
  Filled 2022-10-22: qty 2

## 2022-10-22 NOTE — Discharge Instructions (Addendum)
El rea ha sido drenada, capaz de expulsar un pequeo grupo de pus   Tome doxiciclina todas las maanas y todas las noches durante 7 das    Sostenga compresas tibias-calientes en el rea afectada al menos 4 veces al da, esto ayuda a facilitar el drenaje, cuanto ms mejor  Regrese para una evaluacin por aumento de la hinchazn, aumento de la sensibilidad o dolor, sitio que no cicatriza, sitio que no drena, comienza a Scientist, research (physical sciences) o escalofros   Tuvo mltiples protuberancias en ambos labios, programe una cita de seguimiento con ginecologa, en   Area has been drained, able to expel a small clump of pus   Take doxycyline every morning and every evening for 7 days    Hold warm-hot compresses to affected area at least 4 times a day, this helps to facilitate draining, the more the better  Please return for evaluation for increased swelling, increased tenderness or pain, non healing site, non draining site, you begin to have fever or chills   You had Multiple bumps over both the labia please schedule follow-up appointment with gynecology, information is listed on front page for further evaluation and management

## 2022-10-22 NOTE — ED Provider Notes (Addendum)
[facility]    CSN: [id number] Arrival date & time: 10/22/22  1005      History   Chief Complaint Chief Complaint  Patient presents with   Groin Swelling    HPI Darlene Salazar is a 52 y.o. female.   Patient presents for evaluation of a bump to the vaginal labia present for 1 month.  Has occurred before, typically improves after use of vapor rub and douching with vinegar water.  Site has begun to bleed over the last 1 to 2 days which prompted evaluation.  Sexually active, 1 female partner, no concern for STD.  Denies vaginal discharge, itching or odor.  Hysterectomy in 2012.  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   Anxiety 05/21/2014   Morbid obesity ([facility]) 05/21/2014   Dysmenorrhea 05/21/2014    Past Surgical History:  Procedure Laterality Date   ABDOMINAL HYSTERECTOMY     BLADDER SURGERY     CHOLECYSTECTOMY     LAPAROSCOPIC VAGINAL HYSTERECTOMY WITH SALPINGECTOMY Bilateral 11/29/2015   Procedure: LAPAROSCOPIC ASSISTED VAGINAL HYSTERECTOMY WITH SALPINGECTOMY;  Surgeon: Allie Bossier, MD;  Location: [facility];  Service: Gynecology;  Laterality: Bilateral;   WISDOM TOOTH EXTRACTION      OB History     Gravida  5   Para  3   Term  3   Preterm  0   AB  2   Living  3      SAB  2   IAB  0   Ectopic  0   Multiple  0   Live Births               Home Medications    Prior to Admission medications   Medication Sig Start Date End Date Taking? Authorizing Provider   ibuprofen (ADVIL) 600 MG tablet Take 1 tablet (600 mg total) by mouth every 6 (six) hours as needed. 07/02/20   Mickie Bail, NP  methocarbamol (ROBAXIN) 500 MG tablet Take 1 tablet (500 mg total) by mouth every 8 (eight) hours as needed for muscle spasms. 05/23/22   Raspet, Noberto Retort, PA-C  omeprazole (PRILOSEC) 20 MG capsule Take 1 capsule (20 mg total) by mouth daily. 05/23/22   Raspet, Noberto Retort, PA-C  sucralfate (CARAFATE) 1 g tablet Take 1 tablet (1 g total) by mouth 4 (four) times daily -  with meals and at bedtime. 05/23/22   Raspet, Denny Peon K, PA-C  rosuvastatin (CRESTOR) 10 MG tablet Take 1 tablet (10 mg total) by mouth daily. 05/09/18 01/24/19  Cain Saupe, MD    Family History Family History  Problem Relation Age of Onset   Diabetes Father    Hyperlipidemia Father    Hypertension Father    Cancer Mother 58       uterine   Diabetes Brother     Social History Social History   Tobacco Use   Smoking status: Never   Smokeless tobacco: Never  Vaping Use   Vaping Use:  Never used  Substance Use Topics   Alcohol use: No   Drug use: No     Allergies   Patient has no known allergies.   Review of Systems Review of Systems   Physical Exam Triage Vital Signs ED Triage Vitals [10/22/22 1157]  Enc Vitals Group     BP 136/87     Pulse Rate 68     Resp 18     Temp 97.7 F (36.5 C)     Temp Source Oral     SpO2 98 %     Weight      Height      Head Circumference      Peak Flow      Pain Score      Pain Loc      Pain Edu?      Excl. in [place]?    No data found.  Updated Vital Signs BP 136/87 (BP Location: Left Arm)   Pulse 68   Temp 97.7 F (36.5 C) (Oral)   Resp 18   LMP 11/12/2015 (LMP Unknown) Comment: spotting  SpO2 98%   Visual Acuity Right Eye Distance:   Left Eye Distance:   Bilateral Distance:    Right Eye Near:   Left Eye Near:    Bilateral Near:     Physical Exam Exam conducted with a chaperone present.  Constitutional:      Appearance: Normal  appearance.  Eyes:     Extraocular Movements: Extraocular movements intact.  Pulmonary:     Effort: Pulmonary effort is normal.  Genitourinary:    Comments: 1 x 1 cm cyst present to the center of the left labia, bleeding, tender and erythematous, patient has various small cysts present along the bilateral labia, at least 9 areas counted Neurological:     Mental Status: She is alert and oriented to person, place, and time. Mental status is at baseline.      UC Treatments / Results  Labs (all labs ordered are listed, but only abnormal results are displayed) Labs Reviewed - No data to display  EKG   Radiology No results found.  Procedures Incision and Drainage  Date/Time: 10/22/2022 1:47 PM  Performed by: Valinda Hoar, NP Authorized by: Valinda Hoar, NP   Consent:    Consent obtained:  Verbal   Consent given by:  Patient   Risks, benefits, and alternatives were discussed: yes   Universal protocol:    Procedure explained and questions answered to patient or proxy's satisfaction: yes     Patient identity confirmed:  Verbally with patient Location:    Type:  Abscess   Size:  1x1 Anesthesia:    Anesthesia method:  Local infiltration   Local anesthetic:  Lidocaine 1% w/o epi Procedure type:    Complexity:  Simple Procedure details:    Incision types:  Single straight   Drainage:  Purulent   Drainage amount:  Moderate   Wound treatment:  Wound left open   Packing materials:  None Post-procedure details:    Procedure completion:  Tolerated  (including critical care time)  Medications Ordered in UC Medications - No data to display  Initial Impression / Assessment and Plan / UC Course  I have reviewed the triage vital signs and the nursing notes.  Pertinent labs & imaging results that were available during my care of the patient were reviewed by me and considered in my medical decision making (see chart for details).  Abscess of the left genital  labia  Incision and drainage completed, expelled a clump of gray to Burnette Valenti drainage, placed on doxycycline and advised warm compresses to the affected area for comfort, given walking referral to gynecology for further evaluation and management of remaining cyst Final Clinical Impressions(s) / UC Diagnoses   Final diagnoses:  None   Discharge Instructions   None    ED Prescriptions   None    PDMP not reviewed this encounter.   Valinda Hoar, NP 10/22/22 1347    Valinda Hoar, NP 10/22/22 1347

## 2022-10-22 NOTE — ED Triage Notes (Signed)
Here for vaginal growth x 1 month. Pt reports the bump is not always. Pt reports the bump has an odor.

## 2023-01-23 ENCOUNTER — Emergency Department (HOSPITAL_COMMUNITY): Payer: Self-pay

## 2023-01-23 ENCOUNTER — Other Ambulatory Visit: Payer: Self-pay

## 2023-01-23 ENCOUNTER — Emergency Department (HOSPITAL_COMMUNITY)
Admission: EM | Admit: 2023-01-23 | Discharge: 2023-01-23 | Disposition: A | Discharge status: Home / Self Care | Payer: Self-pay | Attending: Emergency Medicine | Admitting: Emergency Medicine

## 2023-01-23 ENCOUNTER — Ambulatory Visit (HOSPITAL_COMMUNITY)
Admission: EM | Admit: 2023-01-23 | Discharge: 2023-01-23 | Disposition: A | Discharge status: Home / Self Care | Payer: Self-pay | Attending: Nurse Practitioner | Admitting: Nurse Practitioner

## 2023-01-23 ENCOUNTER — Encounter (HOSPITAL_COMMUNITY): Payer: Self-pay

## 2023-01-23 ENCOUNTER — Encounter (HOSPITAL_COMMUNITY): Payer: Self-pay | Admitting: Emergency Medicine

## 2023-01-23 DIAGNOSIS — R109 Unspecified abdominal pain: Secondary | ICD-10-CM

## 2023-01-23 DIAGNOSIS — R509 Fever, unspecified: Secondary | ICD-10-CM

## 2023-01-23 DIAGNOSIS — K529 Noninfective gastroenteritis and colitis, unspecified: Secondary | ICD-10-CM | POA: Insufficient documentation

## 2023-01-23 DIAGNOSIS — R319 Hematuria, unspecified: Secondary | ICD-10-CM

## 2023-01-23 DIAGNOSIS — R10A0 Flank pain, unspecified side: Secondary | ICD-10-CM

## 2023-01-23 DIAGNOSIS — Z20822 Contact with and (suspected) exposure to covid-19: Secondary | ICD-10-CM | POA: Insufficient documentation

## 2023-01-23 LAB — COMPREHENSIVE METABOLIC PANEL
ALT: 39 U/L (ref 0–44)
AST: 27 U/L (ref 15–41)
Albumin: 3.5 g/dL (ref 3.5–5.0)
Alkaline Phosphatase: 84 U/L (ref 38–126)
Anion gap: 12 (ref 5–15)
BUN: 9 mg/dL (ref 6–20)
CO2: 21 mmol/L — ABNORMAL LOW (ref 22–32)
Calcium: 8.6 mg/dL — ABNORMAL LOW (ref 8.9–10.3)
Chloride: 101 mmol/L (ref 98–111)
Creatinine, Ser: 0.88 mg/dL (ref 0.44–1.00)
GFR, Estimated: >60 mL/min (ref >60)
Glucose, Bld: 139 mg/dL — ABNORMAL HIGH (ref 70–99)
Potassium: 3.5 mmol/L (ref 3.5–5.1)
Sodium: 134 mmol/L — ABNORMAL LOW (ref 135–145)
Total Bilirubin: 0.4 mg/dL (ref 0.3–1.2)
Total Protein: 7.2 g/dL (ref 6.5–8.1)

## 2023-01-23 LAB — URINALYSIS, ROUTINE W REFLEX MICROSCOPIC
Bilirubin Urine: NEGATIVE
Glucose, UA: NEGATIVE mg/dL
Hgb urine dipstick: NEGATIVE
Ketones, ur: NEGATIVE mg/dL
Leukocytes,Ua: NEGATIVE
Nitrite: NEGATIVE
Protein, ur: NEGATIVE mg/dL
Specific Gravity, Urine: 1.003 — ABNORMAL LOW (ref 1.005–1.030)
pH: 6 (ref 5.0–8.0)

## 2023-01-23 LAB — CBC
HCT: 38.7 % (ref 36.0–46.0)
Hemoglobin: 12.5 g/dL (ref 12.0–15.0)
MCH: 26.6 pg (ref 26.0–34.0)
MCHC: 32.3 g/dL (ref 30.0–36.0)
MCV: 82.3 fL (ref 80.0–100.0)
Platelets: 343 10*3/uL (ref 150–400)
RBC: 4.7 MIL/uL (ref 3.87–5.11)
RDW: 14.1 % (ref 11.5–15.5)
WBC: 12.8 10*3/uL — ABNORMAL HIGH (ref 4.0–10.5)
nRBC: 0 % (ref 0.0–0.2)

## 2023-01-23 LAB — POCT URINALYSIS DIP (MANUAL ENTRY)
Bilirubin, UA: NEGATIVE
Glucose, UA: NEGATIVE mg/dL
Ketones, POC UA: NEGATIVE mg/dL
Leukocytes, UA: NEGATIVE
Nitrite, UA: NEGATIVE
Protein Ur, POC: NEGATIVE mg/dL
Spec Grav, UA: 1.015 (ref 1.010–1.025)
Urobilinogen, UA: 0.2 E.U./dL
pH, UA: 6 (ref 5.0–8.0)

## 2023-01-23 LAB — RESP PANEL BY RT-PCR (RSV, FLU A&B, COVID)  RVPGX2
Influenza A by PCR: NEGATIVE
Influenza B by PCR: NEGATIVE
Resp Syncytial Virus by PCR: NEGATIVE
SARS Coronavirus 2 by RT PCR: NEGATIVE

## 2023-01-23 LAB — LIPASE, BLOOD: Lipase: 26 U/L (ref 11–51)

## 2023-01-23 MED ORDER — IBUPROFEN 800 MG PO TABS: 800.0000 mg | ORAL_TABLET | Freq: Once | ORAL | Status: DC

## 2023-01-23 MED ORDER — ACETAMINOPHEN 325 MG PO TABS
ORAL_TABLET | ORAL | Status: AC
  Filled 2023-01-23: qty 3

## 2023-01-23 MED ORDER — ONDANSETRON 4 MG PO TBDP
ORAL_TABLET | ORAL | Status: AC
  Filled 2023-01-23: qty 1

## 2023-01-23 MED ORDER — IOHEXOL 350 MG/ML SOLN
75.0000 mL | Freq: Once | INTRAVENOUS | Status: AC | PRN
  Administered 2023-01-23: 75 mL via INTRAVENOUS

## 2023-01-23 MED ORDER — ONDANSETRON 4 MG PO TBDP
4.0000 mg | ORAL_TABLET | Freq: Once | ORAL | Status: AC
  Administered 2023-01-23: 4 mg via ORAL

## 2023-01-23 MED ORDER — SODIUM CHLORIDE 0.9 % IV BOLUS
1000.0000 mL | Freq: Once | INTRAVENOUS | Status: AC
  Administered 2023-01-23: 1000 mL via INTRAVENOUS

## 2023-01-23 MED ORDER — ACETAMINOPHEN 325 MG PO TABS
975.0000 mg | ORAL_TABLET | Freq: Once | ORAL | Status: AC
  Administered 2023-01-23: 975 mg via ORAL

## 2023-01-23 MED ORDER — AMOXICILLIN-POT CLAVULANATE 875-125 MG PO TABS
1.0000 | ORAL_TABLET | Freq: Two times a day (BID) | ORAL | 0 refills | Status: AC
  Filled 2023-01-23: qty 14, 7d supply, fill #0

## 2023-01-23 MED ORDER — KETOROLAC TROMETHAMINE 15 MG/ML IJ SOLN
15.0000 mg | Freq: Once | INTRAMUSCULAR | Status: AC
  Administered 2023-01-23: 15 mg via INTRAVENOUS
  Filled 2023-01-23: qty 1

## 2023-01-23 NOTE — ED Notes (Signed)
Pt transported to CT ?

## 2023-01-23 NOTE — ED Provider Notes (Signed)
[facility] Provider Note   CSN: [id number] Arrival date & time: 01/23/23  1652     History  Chief Complaint  Patient presents with   Abdominal Pain    Darlene Salazar is a 52 y.o. female.  Patient with history of GERD, morbid obesity, anxiety presents today with complaints of abdominal pain. She states that she has had abdominal pain for the past 1 month that got worse on Monday. Pain is in the right side and does not radiate. No discernable triggers or aggravating or relieving factors. She went to urgent care and was found to be febrile and was sent here for evaluation. She notes associated nausea without vomiting. Does also note some diarrhea that started yesterday and is not bloody or melanous. Denies any recent exposures to suspicious foods or water sources. No recent travel. No sick contacts. She is having regular bowel movements. Given zofran and tylenol at urgent care which helped. She is no longer nauseous. She states that she has also had bodyaches and urine with a 'strong odor.' Denies any dysuria. No cough or congestion. Denies vaginal discharge. Denies any history of similar symptoms previously. Notes history of cholecystectomy and hysterectomy.   The history is provided by the patient. A language interpreter was used.  Abdominal Pain Associated symptoms: diarrhea and nausea        Home Medications Prior to Admission medications   Medication Sig Start Date End Date Taking? Authorizing Provider  rosuvastatin (CRESTOR) 10 MG tablet Take 1 tablet (10 mg total) by mouth daily. 05/09/18 01/24/19  Cain Saupe, MD      Allergies    Patient has no known allergies.    Review of Systems   Review of Systems  Gastrointestinal:  Positive for abdominal pain, diarrhea and nausea.  All other systems reviewed and are negative.   Physical Exam Updated Vital Signs BP 130/74   Pulse (!) 117   Temp 99.3 F (37.4 C) (Oral)   Resp  16   LMP 11/12/2015 (LMP Unknown) Comment: spotting  SpO2 98%  Physical Exam Vitals and nursing note reviewed.  Constitutional:      General: She is not in acute distress.    Appearance: Normal appearance. She is normal weight. She is not ill-appearing, toxic-appearing or diaphoretic.  HENT:     Head: Normocephalic and atraumatic.  Cardiovascular:     Rate and Rhythm: Normal rate.  Pulmonary:     Effort: Pulmonary effort is normal. No respiratory distress.  Abdominal:     General: Abdomen is flat.     Palpations: Abdomen is soft.     Tenderness: There is no guarding or rebound.     Comments: Mild right sided abdominal tenderness  Musculoskeletal:        General: Normal range of motion.     Cervical back: Normal range of motion.  Skin:    General: Skin is warm and dry.  Neurological:     General: No focal deficit present.     Mental Status: She is alert.  Psychiatric:        Mood and Affect: Mood normal.        Behavior: Behavior normal.     ED Results / Procedures / Treatments   Labs (all labs ordered are listed, but only abnormal results are displayed) Labs Reviewed  COMPREHENSIVE METABOLIC PANEL - Abnormal; Notable for the following components:      Result Value   Sodium 134 (*)  CO2 21 (*)    Glucose, Bld 139 (*)    Calcium 8.6 (*)    All other components within normal limits  CBC - Abnormal; Notable for the following components:   WBC 12.8 (*)    All other components within normal limits  URINALYSIS, ROUTINE W REFLEX MICROSCOPIC - Abnormal; Notable for the following components:   Color, Urine COLORLESS (*)    Specific Gravity, Urine 1.003 (*)    Bacteria, UA RARE (*)    All other components within normal limits  RESP PANEL BY RT-PCR (RSV, FLU A&B, COVID)  RVPGX2  LIPASE, BLOOD    EKG None  Radiology CT ABDOMEN PELVIS W CONTRAST  Result Date: 01/23/2023 CLINICAL DATA:  Right lower quadrant abdominal pain. EXAM: CT ABDOMEN AND PELVIS WITH CONTRAST  TECHNIQUE: Multidetector CT imaging of the abdomen and pelvis was performed using the standard protocol following bolus administration of intravenous contrast. RADIATION DOSE REDUCTION: This exam was performed according to the departmental dose-optimization program which includes automated exposure control, adjustment of the mA and/or kV according to patient size and/or use of iterative reconstruction technique. CONTRAST:  75mL OMNIPAQUE IOHEXOL 350 MG/ML SOLN COMPARISON:  July 21, 2020 FINDINGS: Lower chest: No acute abnormality. Hepatobiliary: There is diffuse fatty infiltration of the liver parenchyma. No focal liver abnormality is seen. Status post cholecystectomy. No biliary dilatation. Pancreas: Unremarkable. No pancreatic ductal dilatation or surrounding inflammatory changes. Spleen: Normal in size without focal abnormality. Adrenals/Urinary Tract: Adrenal glands are unremarkable. Kidneys are normal, without renal calculi, focal lesion, or hydronephrosis. Bladder is unremarkable. Stomach/Bowel: Stomach is within normal limits. Appendix appears normal. The cecum and ascending colon are mild the thickened and inflamed. No evidence of bowel dilatation. Vascular/Lymphatic: Mild aortic atherosclerosis. No enlarged abdominal or pelvic lymph nodes. Reproductive: Status post hysterectomy. No adnexal masses. Other: No abdominal wall hernia or abnormality. No abdominopelvic ascites. Musculoskeletal: There is grade 1 anterolisthesis of the L5 vertebral body on S1 with multilevel degenerative changes seen throughout the lumbar spine. IMPRESSION: 1. Mild colitis involving the cecum and ascending colon. 2. Fatty liver. 3. Evidence of prior cholecystectomy and prior hysterectomy. 4. Aortic atherosclerosis. Aortic Atherosclerosis (ICD10-I70.0). Electronically Signed   By: Aram Candela M.D.   On: 01/23/2023 22:14    Procedures Procedures    Medications Ordered in ED Medications  ketorolac (TORADOL) 15 MG/ML  injection 15 mg (15 mg Intravenous Given 01/23/23 2135)  sodium chloride 0.9 % bolus 1,000 mL (1,000 mLs Intravenous New Bag/Given 01/23/23 2138)  iohexol (OMNIPAQUE) 350 MG/ML injection 75 mL (75 mLs Intravenous Contrast Given 01/23/23 2202)    ED Course/ Medical Decision Making/ A&P                                 Medical Decision Making Amount and/or Complexity of Data Reviewed Labs: ordered. Radiology: ordered.  Risk Prescription drug management.   This patient is a 52 y.o. female who presents to the ED for concern of abdominal pain, fever, this involves an extensive number of treatment options, and is a complaint that carries with it a high risk of complications and morbidity. The emergent differential diagnosis prior to evaluation includes, but is not limited to,  history of GERD, morbid obesity, anxiety  . This is not an exhaustive differential.   Past Medical History / Co-morbidities / Social History: Spanish speaking only, low health literacy, no pcp  Additional history: Chart reviewed. Pertinent results include: temp  102 at urgent care, given zofran and tylenol  Physical Exam: Physical exam performed. The pertinent findings include: mild RLQ TTP  Lab Tests: I ordered, and personally interpreted labs.  The pertinent results include:  WBC 12.8, Na 134, bicarb 21, glucose 139. Covid negative   Imaging Studies: I ordered imaging studies including CT abdomen pelvis. I independently visualized and interpreted imaging which showed   1. Mild colitis involving the cecum and ascending colon. 2. Fatty liver. 3. Evidence of prior cholecystectomy and prior hysterectomy. 4. Aortic atherosclerosis.  I agree with the radiologist interpretation.   Medications: I ordered medication including toradol and fluids  for pain, dehydration. Reevaluation of the patient after these medicines showed that the patient resolved. I have reviewed the patients home medicines and have made adjustments as  needed.  Disposition: After consideration of the diagnostic results and the patients response to treatment, I feel that emergency department workup does not suggest an emergent condition requiring admission or immediate intervention beyond what has been performed at this time. The plan is: discharge with Augmentin for colitis and close outpatient follow-up. After fluids and toradol, patient feeling significantly improved. She is able to eat and drink without any residual pain or nausea. She is ready to go home. Will give referral to wellness center for follow-up given no pcp. Evaluation and diagnostic testing in the emergency department does not suggest an emergent condition requiring admission or immediate intervention beyond what has been performed at this time.  Plan for discharge with close PCP follow-up.  Patient is understanding and amenable with plan, educated on red flag symptoms that would prompt immediate return.  Patient discharged in stable condition.  Final Clinical Impression(s) / ED Diagnoses Final diagnoses:  Colitis    Rx / DC Orders ED Discharge Orders          Ordered    amoxicillin-clavulanate (AUGMENTIN) 875-125 MG tablet  Every 12 hours        01/23/23 2311          An After Visit Summary was printed and given to the patient.     Vear Clock 01/23/23 2317    Rondel Baton, MD 01/24/23 [phone]

## 2023-01-23 NOTE — ED Provider Notes (Signed)
[facility]    CSN: [id number] Arrival date & time: 01/23/23  1324      History   Chief Complaint Chief Complaint  Patient presents with   Back Pain   Nausea   Fever   Dysuria    , dysutria    HPI Darlene Salazar is a 52 y.o. female.   Patient presents today with 2-day history of bodyaches, urinary frequency and urgency, fever, strong urinary odor, low right abdominal pain that is constant, and right sided flank pain.  Also has a little bit of nausea, however no vomiting.  No cough, congestion, sore throat, or runny/stuffy nose.  Has only been able to drink water today.  No dysuria, new urinary incontinence, or blood in the urine.  No vaginal discharge.  Has taken NyQuil for symptoms without improvement.  Does not take any medication on a regular basis.    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   Anxiety 05/21/2014   Morbid obesity (HCC) 05/21/2014   Dysmenorrhea 05/21/2014    Past Surgical History:  Procedure Laterality Date   ABDOMINAL HYSTERECTOMY     BLADDER SURGERY     CHOLECYSTECTOMY     LAPAROSCOPIC VAGINAL HYSTERECTOMY WITH SALPINGECTOMY Bilateral 11/29/2015   Procedure: LAPAROSCOPIC ASSISTED VAGINAL HYSTERECTOMY WITH SALPINGECTOMY;  Surgeon: Allie Bossier, MD;  Location: [facility];  Service: Gynecology;  Laterality: Bilateral;   WISDOM TOOTH EXTRACTION      OB History     Gravida  5   Para  3   Term  3   Preterm  0   AB  2   Living  3      SAB  2   IAB   0   Ectopic  0   Multiple  0   Live Births               Home Medications    Prior to Admission medications   Medication Sig Start Date End Date Taking? Authorizing Provider  rosuvastatin (CRESTOR) 10 MG tablet Take 1 tablet (10 mg total) by mouth daily. 05/09/18 01/24/19  Cain Saupe, MD    Family History Family History  Problem Relation Age of Onset   Diabetes Father    Hyperlipidemia Father    Hypertension Father    Cancer Mother 25       uterine   Diabetes Brother     Social History Social History   Tobacco Use   Smoking status: Never   Smokeless tobacco: Never  Vaping Use   Vaping status: Never Used  Substance Use Topics   Alcohol use: No   Drug use: No     Allergies   Patient has no known allergies.   Review of Systems Review of Systems Per HPI  Physical Exam Triage Vital Signs ED Triage Vitals [01/23/23 1454]  Encounter Vitals Group     BP 124/80     Systolic BP Percentile      Diastolic BP  Percentile      Pulse Rate (!) 122     Resp 18     Temp (!) 102 F (38.9 C)     Temp Source Oral     SpO2 96 %     Weight      Height      Head Circumference      Peak Flow      Pain Score      Pain Loc      Pain Education      Exclude from Growth Chart    No data found.  Updated Vital Signs BP 124/80 (BP Location: Left Arm)   Pulse (!) 122   Temp (!) 102 F (38.9 C) (Oral)   Resp 18   LMP 11/12/2015 (LMP Unknown) Comment: spotting  SpO2 96%   Visual Acuity Right Eye Distance:   Left Eye Distance:   Bilateral Distance:    Right Eye Near:   Left Eye Near:    Bilateral Near:     Physical Exam Vitals and nursing note reviewed.  Constitutional:      General: She is not in acute distress.    Appearance: She is ill-appearing. She is not toxic-appearing.  HENT:     Mouth/Throat:     Mouth: Mucous membranes are moist.     Pharynx: Oropharynx is clear. No oropharyngeal exudate or posterior oropharyngeal erythema.  Abdominal:      General: Abdomen is flat. Bowel sounds are normal. There is no distension.     Palpations: Abdomen is soft. There is no mass.     Tenderness: There is abdominal tenderness. There is right CVA tenderness. There is no left CVA tenderness or guarding.  Musculoskeletal:     Cervical back: Normal range of motion.  Lymphadenopathy:     Cervical: No cervical adenopathy.  Skin:    General: Skin is warm and dry.     Coloration: Skin is not jaundiced or pale.     Findings: No erythema.  Neurological:     Mental Status: She is alert and oriented to person, place, and time.  Psychiatric:        Behavior: Behavior is cooperative.      UC Treatments / Results  Labs (all labs ordered are listed, but only abnormal results are displayed) Labs Reviewed  POCT URINALYSIS DIP (MANUAL ENTRY) - Abnormal; Notable for the following components:      Result Value   Clarity, UA hazy (*)    Blood, UA small (*)    All other components within normal limits    EKG   Radiology No results found.  Procedures Procedures (including critical care time)  Medications Ordered in UC Medications  ondansetron (ZOFRAN-ODT) disintegrating tablet 4 mg (4 mg Oral Given 01/23/23 1510)  acetaminophen (TYLENOL) tablet 975 mg (975 mg Oral Given 01/23/23 1509)    Initial Impression / Assessment and Plan / UC Course  I have reviewed the triage vital signs and the nursing notes.  Pertinent labs & imaging results that were available during my care of the patient were reviewed by me and considered in my medical decision making (see chart for details).   Patient is ill-appearing, tachycardic, tachypneic, and febrile in triage.  Tylenol was given without any improvement in the fever or tachycardia.  1. Fever, unspecified 2. Hematuria, unspecified type 3. Flank pain Urinalysis today is hazy with small mount of blood I am concerned given fever, differentials include nephrolithiasis with possible obstructed kidney and  developing  hydronephrosis; pyelonephritis; acute cystitis with superimposed viral infection I recommended further evaluation and management in emergency room given symptoms Patient is in agreement to plan Patient is safe to transport via private vehicle  The patient was given the opportunity to ask questions.  All questions answered to their satisfaction.  The patient is in agreement to this plan.    Final Clinical Impressions(s) / UC Diagnoses   Final diagnoses:  Fever, unspecified  Hematuria, unspecified type  Flank pain     Discharge Instructions      Please go to the ER for further evaluation of your symptoms     ED Prescriptions   None    PDMP not reviewed this encounter.   Valentino Nose, NP 01/23/23 [phone]

## 2023-01-23 NOTE — Discharge Instructions (Addendum)
As we discussed, your CT scan showed some inflammation in the walls of your intestines known as colitis. This could be viral or bacterial in nature. I have given you prescription for Augmentin which is an antibiotic to cover for any bacterial cause of symptoms.  Please fill and take this as prescribed in its entirety. Please ensure you are maintaining adequate hydration with electrolytes as well. You should also take tylenol/ibuprofen for your fever and bodyaches. I have also given you a referral to the wellness center to follow-up with since you do not have a primary care doctor. Please call to schedule an appointment.   Return if development of any new or worsening symptoms.   Como comentamos, su tomografa computarizada mostr cierta inflamacin en las paredes de sus intestinos conocida como colitis. Esto podra ser de naturaleza viral o bacteriana. Le he recetado Augmentin, que es un antibitico para cubrir cualquier causa bacteriana de los sntomas.  Llnelo y tmelo segn lo prescrito en su totalidad. Asegrese tambin de mantener una hidratacin adecuada con electrolitos. Tambin debe tomar tylenol/ibuprofeno para la fiebre y los dolores corporales. Tambin te he remitido al centro de bienestar para que hagas un seguimiento ya que no tienes un mdico de [place]. Favor de llamar para hacer una cita.   Regrese si desarrolla algn sntoma nuevo o que empeora.

## 2023-01-23 NOTE — Discharge Instructions (Addendum)
Please go to the ER for further evaluation of your symptoms  

## 2023-01-23 NOTE — ED Triage Notes (Signed)
Pt present with nausea and body aches with fever since Monday  Seen at [facility] today and told she had blood in her urine and needed to come to the ED to be "checked for stones"

## 2023-01-23 NOTE — ED Notes (Signed)
Provider at bedside

## 2023-01-23 NOTE — ED Notes (Signed)
Patient transported to CT 

## 2023-01-23 NOTE — ED Triage Notes (Signed)
Per Krista Blue [phone]  Patient c/o generalized body aches, nausea, dysuria, urinary frequency and chills 2 days ago.Patient states she has had right lower back pain that radiates down her right hip x 1 month.  Patient states she has been taking Nyquil and the last dose was at 0600 today.

## 2023-01-23 NOTE — ED Notes (Addendum)
Patient states she is unable to urinate. Patient given a cup of water to drink.

## 2023-01-24 ENCOUNTER — Other Ambulatory Visit: Payer: Self-pay
